# Patient Record
Sex: Female | Born: 1980 | Race: White | Hispanic: No | Marital: Married | Smoking: Former smoker
Health system: Southern US, Community
[De-identification: ages and names within clinical notes are randomized; demographics above are authoritative.]

## PROBLEM LIST (undated history)

## (undated) ENCOUNTER — Inpatient Hospital Stay (HOSPITAL_COMMUNITY): Payer: Self-pay

## (undated) DIAGNOSIS — R51 Headache: Secondary | ICD-10-CM

## (undated) DIAGNOSIS — F329 Major depressive disorder, single episode, unspecified: Secondary | ICD-10-CM

## (undated) DIAGNOSIS — L309 Dermatitis, unspecified: Secondary | ICD-10-CM

## (undated) DIAGNOSIS — R569 Unspecified convulsions: Secondary | ICD-10-CM

## (undated) DIAGNOSIS — K8020 Calculus of gallbladder without cholecystitis without obstruction: Secondary | ICD-10-CM

## (undated) DIAGNOSIS — F32A Depression, unspecified: Secondary | ICD-10-CM

## (undated) DIAGNOSIS — F419 Anxiety disorder, unspecified: Secondary | ICD-10-CM

## (undated) HISTORY — DX: Depression, unspecified: F32.A

## (undated) HISTORY — DX: Major depressive disorder, single episode, unspecified: F32.9

## (undated) HISTORY — DX: Anxiety disorder, unspecified: F41.9

## (undated) HISTORY — PX: NO PAST SURGERIES: SHX2092

---

## 2003-01-31 ENCOUNTER — Encounter: Payer: Self-pay | Admitting: Emergency Medicine

## 2003-01-31 ENCOUNTER — Emergency Department (HOSPITAL_COMMUNITY): Admission: AD | Admit: 2003-01-31 | Discharge: 2003-01-31 | Payer: Self-pay | Admitting: Emergency Medicine

## 2004-05-21 ENCOUNTER — Other Ambulatory Visit: Admission: RE | Admit: 2004-05-21 | Discharge: 2004-05-21 | Payer: Self-pay | Admitting: Family Medicine

## 2004-06-08 ENCOUNTER — Emergency Department (HOSPITAL_COMMUNITY): Admission: EM | Admit: 2004-06-08 | Discharge: 2004-06-09 | Payer: Self-pay

## 2004-07-08 ENCOUNTER — Ambulatory Visit (HOSPITAL_COMMUNITY): Admission: RE | Admit: 2004-07-08 | Discharge: 2004-07-08 | Payer: Self-pay | Admitting: Family Medicine

## 2004-09-14 ENCOUNTER — Inpatient Hospital Stay (HOSPITAL_COMMUNITY): Admission: AD | Admit: 2004-09-14 | Discharge: 2004-09-14 | Payer: Self-pay | Admitting: Family Medicine

## 2004-10-30 ENCOUNTER — Inpatient Hospital Stay (HOSPITAL_COMMUNITY): Admission: AD | Admit: 2004-10-30 | Discharge: 2004-10-30 | Payer: Self-pay | Admitting: Family Medicine

## 2004-11-02 ENCOUNTER — Observation Stay (HOSPITAL_COMMUNITY): Admission: AD | Admit: 2004-11-02 | Discharge: 2004-11-03 | Payer: Self-pay | Admitting: Family Medicine

## 2004-11-22 ENCOUNTER — Inpatient Hospital Stay (HOSPITAL_COMMUNITY): Admission: AD | Admit: 2004-11-22 | Discharge: 2004-11-22 | Payer: Self-pay | Admitting: Family Medicine

## 2004-11-24 ENCOUNTER — Inpatient Hospital Stay (HOSPITAL_COMMUNITY): Admission: AD | Admit: 2004-11-24 | Discharge: 2004-11-24 | Payer: Self-pay | Admitting: Family Medicine

## 2004-12-02 ENCOUNTER — Inpatient Hospital Stay (HOSPITAL_COMMUNITY): Admission: AD | Admit: 2004-12-02 | Discharge: 2004-12-04 | Payer: Self-pay | Admitting: Family Medicine

## 2004-12-02 ENCOUNTER — Encounter (INDEPENDENT_AMBULATORY_CARE_PROVIDER_SITE_OTHER): Payer: Self-pay | Admitting: Specialist

## 2005-07-07 ENCOUNTER — Emergency Department (HOSPITAL_COMMUNITY): Admission: EM | Admit: 2005-07-07 | Discharge: 2005-07-08 | Payer: Self-pay | Admitting: Emergency Medicine

## 2006-01-09 ENCOUNTER — Ambulatory Visit (HOSPITAL_COMMUNITY): Admission: RE | Admit: 2006-01-09 | Discharge: 2006-01-09 | Payer: Self-pay | Admitting: Obstetrics & Gynecology

## 2006-05-27 ENCOUNTER — Inpatient Hospital Stay (HOSPITAL_COMMUNITY): Admission: AD | Admit: 2006-05-27 | Discharge: 2006-05-27 | Payer: Self-pay | Admitting: Obstetrics

## 2006-06-09 ENCOUNTER — Inpatient Hospital Stay (HOSPITAL_COMMUNITY): Admission: AD | Admit: 2006-06-09 | Discharge: 2006-06-12 | Payer: Self-pay | Admitting: Obstetrics & Gynecology

## 2007-03-02 ENCOUNTER — Other Ambulatory Visit: Admission: RE | Admit: 2007-03-02 | Discharge: 2007-03-02 | Payer: Self-pay | Admitting: Family Medicine

## 2007-07-09 ENCOUNTER — Observation Stay (HOSPITAL_COMMUNITY): Admission: AD | Admit: 2007-07-09 | Discharge: 2007-07-09 | Payer: Self-pay | Admitting: Obstetrics and Gynecology

## 2007-11-03 ENCOUNTER — Inpatient Hospital Stay (HOSPITAL_COMMUNITY): Admission: AD | Admit: 2007-11-03 | Discharge: 2007-11-06 | Payer: Self-pay | Admitting: Obstetrics and Gynecology

## 2010-11-17 ENCOUNTER — Encounter: Payer: Self-pay | Admitting: Obstetrics & Gynecology

## 2011-03-14 NOTE — H&P (Signed)
Haley Clements, Haley Clements                  ACCOUNT NO.:  [id number]   MEDICAL RECORD NO.:  [id number]          PATIENT TYPE:  INP   LOCATION:  9167                          FACILITY:  [facility]   PHYSICIAN:  Roseanna Rainbow, M.D.DATE OF BIRTH:  September 24, 1981   DATE OF ADMISSION:  06/09/2006  DATE OF DISCHARGE:                                HISTORY & PHYSICAL   CHIEF COMPLAINT:  The patient is a 30 year old gravida 2, para 1, with an  estimated date of confinement of June 04, 2006, with an intrauterine  pregnancy at 40+ weeks, complaining of uterine contractions.   HISTORY OF PRESENT ILLNESS:  Please see the above.  She denies rupture of  membranes.   PAST MEDICAL HISTORY:  Depression.   ALLERGIES:  LATEX and NSAIDS.   MEDICATIONS:  Wellbutrin and prenatal vitamins.   OBSTETRICAL RISK FACTORS:  Short inter-pregnancy interval.   TEST RESULTS:  Hemoglobin is 12.5, hematocrit 37.5, platelets 225,000.  Chlamydia negative.  GC negative.  Urine culture and sensitivity:  No  growth.  One-hour GCT 109.  GBS on May 04, 2006 negative.  Hepatitis B  surface antigen negative.  HIV negative.  Blood type is O positive, antibody  screen negative.  RPR nonreactive.  Rubella immune.   PAST OBSTETRICAL HISTORY:  In February of 2006, she was delivered of a live-  born female, 7 pounds 7 ounces, spontaneous vaginal delivery complicated by  threatened preterm labor.   PAST GYNECOLOGICAL HISTORY:  Noncontributory.   PAST MEDICAL HISTORY:  No significant history of medical diseases.   PAST SURGICAL HISTORY:  She denies.   SOCIAL HISTORY:  She is employed in a [employer].  She is married, living  with her spouse, does not give any significant history of alcohol use.  She  has no significant smoking history.  Denies illicit drug use.   FAMILY HISTORY:  Heart disease, adult-onset diabetes, emphysema.   PHYSICAL EXAMINATION:  VITAL SIGNS:  Stable.  Afebrile.  Fetal heart tracing  reassuring.   Tocodynamometer:  Uterine contractions every 2-5 minutes.  PELVIC:  On sterile vaginal exam per the RN, cervix is 3-cm dilated, 80%  effaced with a bulging bag of water.   ASSESSMENT:  1. Primipara at term.  2. Early labor.  3. Fetal heart tracing consistent with fetal well-being.   PLAN:  Admission, expectant management, anticipate a spontaneous vaginal  delivery.      Roseanna Rainbow, M.D.  Electronically Signed     LAJ/MEDQ  D:  06/09/2006  T:  06/09/2006  Job:  [id number]

## 2011-03-14 NOTE — Discharge Summary (Signed)
Haley, Clements                  ACCOUNT NO.:  [id number]   MEDICAL RECORD NO.:  [id number]          PATIENT TYPE:  OBV   LOCATION:  9306                          [facility]ACILITY:  [facility]   PHYSICIAN:  Kendra H. Tenny Craw, MD     DATE OF BIRTH:  04/03/1981   DATE OF ADMISSION:  07/09/2007  DATE OF DISCHARGE:  07/09/2007                               DISCHARGE SUMMARY   FINAL DIAGNOSES:  1. Intrauterine pregnancy at 20-6/[redacted] weeks gestation.  2. Nausea/vomiting.   COMPLICATIONS:  None.   This 30 year old G3, P2, presents at [redacted] weeks gestation with nausea and  vomiting x1 day.  I do not have the patient's antepartum records in  here, but no noted complications up till this point.  The patient was  started on IV hydration, Phenergan, and was started on Ancef  IV.  Some  lab work was obtained.  The patient had a UTI that was treated with the  Ancef; otherwise, had no other complications.  She was feeling obviously  better on this regimen and was allowed to go home later that day.  She  was sent home on a bland diet, told to continue her vitamins daily, was  to follow up in our office on October 10th for her appointment; of  course to call if these symptoms returned.   LABORATORY DATA ON DISCHARGE:  The patient had a hemoglobin of 13.1, a  white blood cell count of 8.9, platelets of 198,000.  The patient also  had a normal liver function test, a normal amylase and lipase, with a  urine that looked positive for urinary tract infection.      Leilani Able, P.A.-C.      Freddrick March. Tenny Craw, MD  Electronically Signed    MB/MEDQ  D:  08/27/2007  T:  08/28/2007  Job:  [id number]

## 2011-07-16 LAB — RPR: RPR Ser Ql: NONREACTIVE

## 2011-07-16 LAB — CBC
HCT: 34.8 — ABNORMAL LOW
MCHC: 33.8
MCHC: 34.5
MCV: 85.1
Platelets: 184
Platelets: 198
RDW: 13.1

## 2011-08-08 LAB — COMPREHENSIVE METABOLIC PANEL
AST: 13
BUN: 4 — ABNORMAL LOW
CO2: 24
Calcium: 8.3 — ABNORMAL LOW
Chloride: 102
Creatinine, Ser: 0.4
GFR calc Af Amer: >60
GFR calc non Af Amer: >60
Total Bilirubin: 1.1

## 2011-08-08 LAB — URINALYSIS, ROUTINE W REFLEX MICROSCOPIC
Bilirubin Urine: NEGATIVE
Nitrite: POSITIVE — AB
Protein, ur: NEGATIVE
Specific Gravity, Urine: >1.03 — ABNORMAL HIGH
Urobilinogen, UA: 1

## 2011-08-08 LAB — CBC
HCT: 36.6
MCHC: 35.7
MCV: 85.3
RBC: 4.29
WBC: 8.9

## 2011-08-08 LAB — LIPASE, BLOOD: Lipase: 13

## 2011-08-08 LAB — DIFFERENTIAL
Basophils Absolute: 0
Eosinophils Relative: 0
Lymphocytes Relative: 6 — ABNORMAL LOW
Lymphs Abs: 0.6 — ABNORMAL LOW
Neutro Abs: 8 — ABNORMAL HIGH
Neutrophils Relative %: 90 — ABNORMAL HIGH

## 2011-08-08 LAB — URINE MICROSCOPIC-ADD ON

## 2011-08-08 LAB — KETONES, URINE: Ketones, ur: >80 — AB

## 2012-04-27 ENCOUNTER — Emergency Department (HOSPITAL_COMMUNITY)
Admission: EM | Admit: 2012-04-27 | Discharge: 2012-04-28 | Disposition: A | Discharge status: Home / Self Care | Payer: Managed Care, Other (non HMO) | Attending: Emergency Medicine | Admitting: Emergency Medicine

## 2012-04-27 ENCOUNTER — Emergency Department (HOSPITAL_COMMUNITY): Payer: Managed Care, Other (non HMO)

## 2012-04-27 ENCOUNTER — Encounter (HOSPITAL_COMMUNITY): Payer: Self-pay | Admitting: *Deleted

## 2012-04-27 DIAGNOSIS — Z79899 Other long term (current) drug therapy: Secondary | ICD-10-CM | POA: Insufficient documentation

## 2012-04-27 DIAGNOSIS — K8020 Calculus of gallbladder without cholecystitis without obstruction: Secondary | ICD-10-CM | POA: Insufficient documentation

## 2012-04-27 DIAGNOSIS — R197 Diarrhea, unspecified: Secondary | ICD-10-CM | POA: Insufficient documentation

## 2012-04-27 HISTORY — DX: Calculus of gallbladder without cholecystitis without obstruction: K80.20

## 2012-04-27 LAB — CBC WITH DIFFERENTIAL/PLATELET
Basophils Absolute: 0.1 10*3/uL (ref 0.0–0.1)
Lymphs Abs: 1.9 10*3/uL (ref 0.7–4.0)
MCV: 85.4 fL (ref 78.0–100.0)
Monocytes Absolute: 0.6 10*3/uL (ref 0.1–1.0)
Monocytes Relative: 9 % (ref 3–12)
Neutrophils Relative %: 62 % (ref 43–77)
Platelets: 267 10*3/uL (ref 150–400)
RBC: 4.79 MIL/uL (ref 3.87–5.11)
RDW: 12.3 % (ref 11.5–15.5)
WBC: 7.1 10*3/uL (ref 4.0–10.5)

## 2012-04-27 LAB — URINALYSIS, ROUTINE W REFLEX MICROSCOPIC
Hgb urine dipstick: NEGATIVE
Nitrite: NEGATIVE
Protein, ur: NEGATIVE mg/dL
Specific Gravity, Urine: 1.03 (ref 1.005–1.030)
Urobilinogen, UA: 0.2 mg/dL (ref 0.0–1.0)

## 2012-04-27 LAB — BASIC METABOLIC PANEL
Calcium: 9 mg/dL (ref 8.4–10.5)
Chloride: 102 mEq/L (ref 96–112)
Creatinine, Ser: 0.77 mg/dL (ref 0.50–1.10)
GFR calc Af Amer: >90 mL/min (ref >90)
Sodium: 140 mEq/L (ref 135–145)

## 2012-04-27 LAB — POCT PREGNANCY, URINE: Preg Test, Ur: NEGATIVE

## 2012-04-27 MED ORDER — SODIUM CHLORIDE 0.9 % IV SOLN
INTRAVENOUS | Status: DC
  Administered 2012-04-27: 21:00:00 via INTRAVENOUS

## 2012-04-27 MED ORDER — GI COCKTAIL ~~LOC~~
30.0000 mL | Freq: Once | ORAL | Status: AC
  Administered 2012-04-27: 30 mL via ORAL
  Filled 2012-04-27: qty 30

## 2012-04-27 MED ORDER — ONDANSETRON HCL 4 MG/2ML IJ SOLN
4.0000 mg | Freq: Once | INTRAMUSCULAR | Status: AC
  Administered 2012-04-27: 4 mg via INTRAVENOUS
  Filled 2012-04-27: qty 2

## 2012-04-27 MED ORDER — MORPHINE SULFATE 4 MG/ML IJ SOLN
4.0000 mg | Freq: Once | INTRAMUSCULAR | Status: AC
  Administered 2012-04-27: 4 mg via INTRAVENOUS
  Filled 2012-04-27: qty 1

## 2012-04-27 MED ORDER — SODIUM CHLORIDE 0.9 % IV BOLUS (SEPSIS): 500.0000 mL | Freq: Once | INTRAVENOUS | Status: DC

## 2012-04-27 MED ORDER — IOHEXOL 300 MG/ML  SOLN
80.0000 mL | Freq: Once | INTRAMUSCULAR | Status: AC | PRN
  Administered 2012-04-27: 80 mL via INTRAVENOUS

## 2012-04-27 MED ORDER — IOHEXOL 300 MG/ML  SOLN
20.0000 mL | INTRAMUSCULAR | Status: AC
  Administered 2012-04-27: 20 mL via ORAL

## 2012-04-27 NOTE — ED Notes (Signed)
Patients states she is still having yellowish liquid stools. Given cup of coke to drink. Plan discussed with patient. Will continue to monitor patient

## 2012-04-27 NOTE — ED Notes (Signed)
Complaining of abdominal pain which patient thought was her gallbladder. Started two weeks ago with nauseous after eating. Progressed to mucus diarrhea. Pain with both eating and drinking.

## 2012-04-27 NOTE — ED Provider Notes (Signed)
Care the patient seemed to the CDU. Patient was moved to CDU for holding awaiting CT of the abdomen and pelvis. She has had crampy sharp pain to the right upper quadrant for the past 2 weeks and reports mucous in her stools. Her CT shows gallstones and possible sludge without evidence of wall thickening which would be worrisome for cholecystitis. Patient states that she has known about gallstones for the past several years but has not previously seen surgery for this. Patient became nauseated on return from CT so was remedicated with GI cocktail and Zofran. She was able to tolerate a by mouth challenge with this. Patient will be discharged home with medications for symptomatic treatment including Zofran and Lomotil. Instructed to make followup with GI and surgery regarding her diarrhea and gallbladder disease respectively. Reasons to return to the ED discussed. She verbalized understanding and agreed to this plan.  Grant Fontana, PA-C 04/28/12 0003

## 2012-04-27 NOTE — ED Provider Notes (Signed)
History     CSN: [id number]  Arrival date & time 04/27/12  1804   First MD Initiated Contact with Patient 04/27/12 1951      Chief Complaint  Patient presents with  . Abdominal Pain    (Consider location/radiation/quality/duration/timing/severity/associated sxs/prior treatment) Patient is a 31 y.o. female presenting with abdominal pain. The history is provided by the patient.  Abdominal Pain The primary symptoms of the illness include abdominal pain, nausea, vomiting and diarrhea. The primary symptoms of the illness do not include shortness of breath.  Additional symptoms associated with the illness include chills. Symptoms associated with the illness do not include back pain.   patient has had abdominal pain for the last 2 weeks. She states it is in her right upper abdomen. She states she's had some chills. She has sharp cramping pain after eating and drinking. She aches all the time. She's had nausea vomiting after eating for the last 2 days. He's also had some diarrhea and mucousy stools for the last month. She states that in 2007 she was told that she had gallstones, but has done nothing because she doesn't want to have surgery. No vaginal bleeding or discharge. No weight loss. No blood in stool.  Past Medical History  Diagnosis Date  . Gallstone     History reviewed. No pertinent past surgical history.  History reviewed. No pertinent family history.  History  Substance Use Topics  . Smoking status: Former Games developer  . Smokeless tobacco: Not on file  . Alcohol Use: Yes     occ    OB History    Grav Para Term Preterm Abortions TAB SAB Ect Mult Living                  Review of Systems  Constitutional: Positive for chills and appetite change. Negative for activity change.  HENT: Negative for neck stiffness.   Eyes: Negative for pain.  Respiratory: Negative for chest tightness and shortness of breath.   Cardiovascular: Negative for chest pain and leg swelling.    Gastrointestinal: Positive for nausea, vomiting, abdominal pain and diarrhea.  Genitourinary: Negative for flank pain.  Musculoskeletal: Negative for back pain.  Skin: Negative for rash.  Neurological: Positive for headaches. Negative for weakness and numbness.  Psychiatric/Behavioral: Negative for behavioral problems.    Allergies  Strawberry  Home Medications   Current Outpatient Rx  Name Route Sig Dispense Refill  . BUSPIRONE HCL 15 MG PO TABS Oral Take 15 mg by mouth 2 (two) times daily as needed. For anxiety    . NORGESTIM-ETH ESTRAD TRIPHASIC 0.18/0.215/0.25 MG-35 MCG PO TABS Oral Take 1 tablet by mouth daily.    Marland Kitchen DIPHENOXYLATE-ATROPINE 2.5-0.025 MG PO TABS Oral Take 1 tablet by mouth 4 (four) times daily as needed for diarrhea or loose stools. 30 tablet 0  . HYDROCODONE-ACETAMINOPHEN 5-325 MG PO TABS Oral Take 1 tablet by mouth every 4 (four) hours as needed for pain. 6 tablet 0  . OMEPRAZOLE 20 MG PO CPDR Oral Take 1 capsule (20 mg total) by mouth daily. 30 capsule 0  . ONDANSETRON 4 MG PO TBDP Oral Take 1 tablet (4 mg total) by mouth every 8 (eight) hours as needed for nausea. 20 tablet 0    BP 119/79  Pulse 69  Temp 97.6 F (36.4 C) (Oral)  Resp 20  SpO2 99%  LMP 04/10/2012  Physical Exam  Nursing note and vitals reviewed. Constitutional: She is oriented to person, place, and time. She appears well-developed  and well-nourished.  HENT:  Head: Normocephalic and atraumatic.  Eyes: EOM are normal. Pupils are equal, round, and reactive to light.  Neck: Normal range of motion. Neck supple.  Cardiovascular: Normal rate, regular rhythm and normal heart sounds.   No murmur heard. Pulmonary/Chest: Effort normal and breath sounds normal. No respiratory distress. She has no wheezes. She has no rales.  Abdominal: Soft. Bowel sounds are normal. She exhibits no distension. There is tenderness. There is no rebound and no guarding.       Mild right-sided abdominal tenderness  without rebound or guarding.  Musculoskeletal: Normal range of motion.  Neurological: She is alert and oriented to person, place, and time. No cranial nerve deficit.  Skin: Skin is warm and dry.  Psychiatric: She has a normal mood and affect. Her speech is normal.    ED Course  Procedures (including critical care time)  Labs Reviewed  URINALYSIS, ROUTINE W REFLEX MICROSCOPIC - Abnormal; Notable for the following:    Color, Urine AMBER (*)  BIOCHEMICALS MAY BE AFFECTED BY COLOR   APPearance HAZY (*)     Bilirubin Urine SMALL (*)     Ketones, ur 15 (*)     All other components within normal limits  CBC WITH DIFFERENTIAL  BASIC METABOLIC PANEL  POCT PREGNANCY, URINE  LAB REPORT - SCANNED   Ct Abdomen Pelvis W Contrast  04/27/2012  *RADIOLOGY REPORT*  Clinical Data: Right upper quadrant pain, diarrhea.  CT ABDOMEN AND PELVIS WITH CONTRAST  Technique:  Multidetector CT imaging of the abdomen and pelvis was performed following the standard protocol during bolus administration of intravenous contrast.  Contrast: 80mL OMNIPAQUE IOHEXOL 300 MG/ML  SOLN, 1 OMNIPAQUE IOHEXOL 300 MG/ML  SOLN  Comparison: None  Findings: Limited images through the lung bases demonstrate no significant appreciable abnormality. The heart size is within normal limits. No pleural or pericardial effusion.  Unremarkable liver, spleen, pancreas, adrenal glands.  There is suggestion of sludge or small stones within the gallbladder. However, no gallbladder wall thickening or pericholecystic fluid. No biliary ductal dilatation.  Symmetric renal enhancement.  No hydronephrosis or hydroureter.  No bowel obstruction.  No CT evidence for colitis.  Normal appendix.  Normal caliber vasculature.  Partially decompressed bladder.  Uterus and adnexa within normal limits for CT.  No acute osseous finding.  IMPRESSION: No acute abnormality identified by CT.  There is a question of small gallstones or sludge layering dependently.  However, no CT  evidence for cholecystitis.  Original Report Authenticated By: Waneta Martins, M.D.     1. Cholelithiasis   2. Diarrhea       MDM  Patient with right upper quadrant pain for 2 weeks. She's also had some mucousy diarrhea. Laboratory reassuring. There is possible small gallstones. She was discharged home follow up with surgery as needed.        Juliet Rude. Rubin Payor, MD 04/28/12 [phone]

## 2012-04-27 NOTE — ED Notes (Addendum)
Pt. Reports abdominal pain in RUQ x 2 weeks. "Sharp cramping pain after eating and  Drinking. Aching pain all the time. N/V after eating x 2 days.  Reports having headaches "probably from not eating. A.O. X 4.  Hxt of gallstones 3 yrs ago.

## 2012-04-27 NOTE — ED Notes (Signed)
PT is here with right upper quad pain and sts she has been putting off surgery for gallbladder.  Pt is reporting nausea and jelly stool without blood for one month.

## 2012-04-28 MED ORDER — DIPHENOXYLATE-ATROPINE 2.5-0.025 MG PO TABS: 1.0000 | ORAL_TABLET | Freq: Four times a day (QID) | ORAL | Status: AC | PRN

## 2012-04-28 MED ORDER — HYDROCODONE-ACETAMINOPHEN 5-325 MG PO TABS: 1.0000 | ORAL_TABLET | ORAL | Status: AC | PRN

## 2012-04-28 MED ORDER — OMEPRAZOLE 20 MG PO CPDR: 20.0000 mg | DELAYED_RELEASE_CAPSULE | Freq: Every day | ORAL | Status: DC

## 2012-04-28 MED ORDER — ONDANSETRON 4 MG PO TBDP: 4.0000 mg | ORAL_TABLET | Freq: Three times a day (TID) | ORAL | Status: AC | PRN

## 2012-04-28 NOTE — ED Provider Notes (Signed)
Medical screening examination/treatment/procedure(s) were performed by non-physician practitioner and as supervising physician I was immediately available for consultation/collaboration.  Jun Rightmyer R. Yuktha Kerchner, MD 04/28/12 0023 

## 2012-04-28 NOTE — ED Notes (Signed)
Patient tolerating fluids. No complaints of nausea or vomiting at this time. Will continue to monitor patient

## 2012-05-26 ENCOUNTER — Ambulatory Visit (INDEPENDENT_AMBULATORY_CARE_PROVIDER_SITE_OTHER): Payer: Managed Care, Other (non HMO) | Admitting: Surgery

## 2012-08-23 LAB — OB RESULTS CONSOLE HIV ANTIBODY (ROUTINE TESTING): HIV: NONREACTIVE

## 2012-08-23 LAB — OB RESULTS CONSOLE HEPATITIS B SURFACE ANTIGEN: Hepatitis B Surface Ag: NEGATIVE

## 2012-08-23 LAB — OB RESULTS CONSOLE RPR: RPR: NONREACTIVE

## 2012-08-23 LAB — OB RESULTS CONSOLE ABO/RH: RH Type: POSITIVE

## 2012-10-27 NOTE — L&D Delivery Note (Addendum)
Delivery Note At 10:12 PM a viable and healthy female was delivered via Vaginal, Spontaneous Delivery (Presentation: ; Occiput Anterior).  APGAR: 8, 9; weight 8 lbs 0 oz.   Placenta status: Intact, Spontaneous.  Cord: 3 vessels.  Anesthesia: Epidural  Episiotomy: None Lacerations: None Est. Blood Loss (mL): 327  Mom to postpartum.  Baby to nursery-stable.  Mickel Baas 03/04/2013, 10:43 PM

## 2012-12-16 ENCOUNTER — Encounter (HOSPITAL_COMMUNITY): Payer: Self-pay

## 2012-12-16 ENCOUNTER — Inpatient Hospital Stay (HOSPITAL_COMMUNITY)
Admission: AD | Admit: 2012-12-16 | Discharge: 2012-12-16 | Disposition: A | Discharge status: Home / Self Care | Payer: Managed Care, Other (non HMO) | Source: Ambulatory Visit | Attending: Obstetrics & Gynecology | Admitting: Obstetrics & Gynecology

## 2012-12-16 DIAGNOSIS — L500 Allergic urticaria: Secondary | ICD-10-CM | POA: Insufficient documentation

## 2012-12-16 DIAGNOSIS — L240 Irritant contact dermatitis due to detergents: Secondary | ICD-10-CM

## 2012-12-16 DIAGNOSIS — O99891 Other specified diseases and conditions complicating pregnancy: Secondary | ICD-10-CM | POA: Insufficient documentation

## 2012-12-16 MED ORDER — PREDNISONE 20 MG PO TABS: 60.0000 mg | ORAL_TABLET | Freq: Every day | ORAL | Status: DC

## 2012-12-16 MED ORDER — DIPHENHYDRAMINE HCL 25 MG PO TABS: 25.0000 mg | ORAL_TABLET | Freq: Four times a day (QID) | ORAL | Status: DC | PRN

## 2012-12-16 MED ORDER — METHYLPREDNISOLONE SODIUM SUCC 125 MG IJ SOLR
125.0000 mg | Freq: Once | INTRAMUSCULAR | Status: AC
  Administered 2012-12-16: 125 mg via INTRAVENOUS
  Filled 2012-12-16: qty 2

## 2012-12-16 MED ORDER — HYDROCORTISONE 1 % EX CREA: TOPICAL_CREAM | Freq: Two times a day (BID) | CUTANEOUS | Status: DC

## 2012-12-16 MED ORDER — LACTATED RINGERS IV SOLN: INTRAVENOUS | Status: DC

## 2012-12-16 MED ORDER — DIPHENHYDRAMINE HCL 50 MG/ML IJ SOLN
50.0000 mg | Freq: Once | INTRAMUSCULAR | Status: AC
  Administered 2012-12-16: 50 mg via INTRAVENOUS
  Filled 2012-12-16: qty 1

## 2012-12-16 NOTE — MAU Note (Signed)
Pt presents for an allergic reaction to a new laundry detergent (a new scent).  She started using the product two days ago and the reaction began as small bumps on her fingers, and it has since progressed to hives all over her body as well as swelling in the extremities and eyes.  She was seen at [facility] around 0345 this morning and was given Benedryl and Vistaril, but the reaction has worsened.  Reports good fetal movement.  Denies any pregnancy related problems.

## 2012-12-16 NOTE — MAU Note (Signed)
Pt states had allergic reaction to sunflower scented Gain detergent. Call OB/GYN who told her to call her primary MD. Has hives all over her body, into her scalp, skin is red/hot to touch, hands and feet are swollen.

## 2012-12-16 NOTE — MAU Provider Note (Signed)
History     CSN: [id number]  Arrival date and time: 12/16/12 1656   None     Chief Complaint  Patient presents with  . Allergic Reaction   HPI 32 y.o. [id number] at [redacted]w[redacted]d with allergic reaction. Washing with new detergent for 4 days and broke out in itchy rash yesterday. Started on hands, now all over body and face. No throat itching or swelling. No wheezing, shortness of breath or cough. Was seen at ER last night and received benadryl and a prescription for hydroxyzine. However, she feels the rash and swelling in her hands is worse today.   Baby is moving normally. No bleeding, contractions or loss of fluid.   OB History   Grav Para Term Preterm Abortions TAB SAB Ect Mult Living   4 3 3  0 0 0 0 0 0 3      Past Medical History  Diagnosis Date  . Gallstone     History reviewed. No pertinent past surgical history.  Family History  Problem Relation Age of Onset  . Diabetes Father   . Hypertension Father   . Cancer Father   . Diabetes Maternal Grandmother   . Hypertension Maternal Grandmother     History  Substance Use Topics  . Smoking status: Former Games developer  . Smokeless tobacco: Not on file  . Alcohol Use: Yes     Comment: occ    Allergies:  Allergies  Allergen Reactions  . Naproxen Nausea And Vomiting  . Strawberry     Throat swells    Prescriptions prior to admission  Medication Sig Dispense Refill  . acetaminophen (TYLENOL) 500 MG tablet Take 1,000-1,500 mg by mouth every 6 (six) hours as needed for pain (For headache).      . busPIRone (BUSPAR) 15 MG tablet Take 7.5 mg by mouth daily as needed. For anxiety      . diphenhydrAMINE (BENADRYL) 25 MG tablet Take 25 mg by mouth every 6 (six) hours as needed for itching or allergies.      . flintstones complete (FLINTSTONES) 60 MG chewable tablet Chew 1 tablet by mouth 2 (two) times daily.      . hydrocortisone cream 1 % Apply 1 application topically daily as needed (For itching).      . hydrOXYzine (VISTARIL)  25 MG capsule Take 25 mg by mouth 2 (two) times daily as needed for itching.        Review of Systems  Constitutional: Negative for fever and chills.  Eyes: Negative for blurred vision and double vision.  Respiratory: Negative for cough, shortness of breath, wheezing and stridor.   Cardiovascular: Negative for chest pain and palpitations.  Gastrointestinal: Negative for nausea, vomiting, abdominal pain and diarrhea.  Genitourinary: Negative for dysuria.  Skin: Positive for itching and rash.  Neurological: Negative for dizziness and headaches.   Physical Exam   Blood pressure 121/69, pulse 121, temperature 98.3 F (36.8 C), temperature source Oral, resp. rate 18, height 5\' 1"  (1.549 m), weight 66.792 kg (147 lb 4 oz), last menstrual period 04/10/2012, SpO2 100.00%.  Physical Exam  Constitutional: She is oriented to person, place, and time. She appears well-developed and well-nourished. No distress.  HENT:  Head: Normocephalic and atraumatic.  Mouth/Throat: Oropharynx is clear and moist.  Eyes: Conjunctivae and EOM are normal.  Neck: Normal range of motion. Neck supple.  Cardiovascular: Regular rhythm and normal heart sounds.   Tachycardia  Respiratory: Effort normal and breath sounds normal. No respiratory distress. She has no wheezes.  GI: Soft. Bowel sounds are normal. There is no tenderness. There is no rebound and no guarding.  Neurological: She is alert and oriented to person, place, and time.  Skin: Skin is warm and dry.  Psychiatric: She has a normal mood and affect.  Skin:  Erythematous wheals and flares on extremities, trunk and face. Hands edematous and red bilaterally, tender.   FHTs:  155-160, mod var, accels present, no decels (occasional short variable) TOCO:  Irritability  MAU Course  Procedures  Solumedrol 125 mg and 50 mg benadryl given in MAU.  Assessment and Plan  32 y.o. [id number] at [redacted]w[redacted]d with allergic reaction to detergent - No respiratory distress,  improved with benadryl. - Discussed with Dr. Claiborne Billings. Discharge home with prednisone through the weekend (60 mg a day) and benadryl (25-50 mg q 6 hr). F/u in clinic on Monday. - Patient to return if any difficulty breathing or tightening of throat.  Napoleon Form 12/16/2012, 6:21 PM

## 2013-01-31 ENCOUNTER — Telehealth: Payer: Self-pay

## 2013-01-31 DIAGNOSIS — Z3490 Encounter for supervision of normal pregnancy, unspecified, unspecified trimester: Secondary | ICD-10-CM

## 2013-01-31 NOTE — Telephone Encounter (Signed)
Chart is on desk

## 2013-01-31 NOTE — Telephone Encounter (Signed)
Need to see chart

## 2013-01-31 NOTE — Telephone Encounter (Signed)
Requested Medical records pull the the chart

## 2013-01-31 NOTE — Telephone Encounter (Signed)
Patient calling in to back date referral for pregnancy that she said we referred her to [facility]  Do not see in her chart that we did that and when we last saw her she had [place] insurance not Medicaid that she says needs to be back dated.  She was seen 12/16/12 for Hives and time before that was sinusitis

## 2013-01-31 NOTE — Telephone Encounter (Signed)
Do not see where we done referral. Pt came in for urine pregnancy test on 07/08/12 but patient was not seen by provider

## 2013-02-03 NOTE — Addendum Note (Signed)
Addended by: Bennie Pierini on: 02/03/2013 10:44 AM   Modules accepted: Orders

## 2013-02-03 NOTE — Telephone Encounter (Signed)
Referral sent to [facility].  

## 2013-02-03 NOTE — Telephone Encounter (Signed)
NEEDS A REFERRAL FROM THE DAY SHE HAD HER PREGNANCY TEST BECAUSE HER MEDICAID CARD HAS OUR NAME ON IT THEY SAID THE ONLY WAY THEY CAN GET IT CHANGED TO [facility] IS IF WE WRITE UP THE REFERRAL AND SEND IT TO THEM

## 2013-02-03 NOTE — Telephone Encounter (Signed)
Referral request sent in

## 2013-02-04 ENCOUNTER — Telehealth: Payer: Self-pay | Admitting: Nurse Practitioner

## 2013-02-11 LAB — OB RESULTS CONSOLE GBS: GBS: NEGATIVE

## 2013-02-15 NOTE — Telephone Encounter (Signed)
Referral sent into [facility] and referral was made

## 2013-02-16 ENCOUNTER — Telehealth: Payer: Self-pay

## 2013-02-16 NOTE — Telephone Encounter (Signed)
Spoke with Alvin Critchley at [facility] she stated pt needed referral for ob dr date back to Sept - informed her I would call [facility] in which I did and left message in billing dept with Sue Lush voice mail of our NPI and giving authorization for Lorilee Cafarella back to September 2013.

## 2013-02-16 NOTE — Telephone Encounter (Signed)
Needs [facility] for pregnancy  [facility]

## 2013-02-19 ENCOUNTER — Inpatient Hospital Stay (HOSPITAL_COMMUNITY)
Admission: AD | Admit: 2013-02-19 | Discharge: 2013-02-20 | Disposition: A | Discharge status: Home / Self Care | Payer: Medicaid Other | Source: Ambulatory Visit | Attending: Obstetrics and Gynecology | Admitting: Obstetrics and Gynecology

## 2013-02-19 ENCOUNTER — Encounter (HOSPITAL_COMMUNITY): Payer: Self-pay | Admitting: *Deleted

## 2013-02-19 DIAGNOSIS — O212 Late vomiting of pregnancy: Secondary | ICD-10-CM | POA: Insufficient documentation

## 2013-02-19 DIAGNOSIS — O479 False labor, unspecified: Secondary | ICD-10-CM | POA: Insufficient documentation

## 2013-02-19 NOTE — MAU Note (Signed)
To recheck in 2 hours per Dr Dareen Piano

## 2013-02-19 NOTE — MAU Note (Signed)
Contractions since about 1700 which are getting closer and stronger. Vomiting some due to pain. No leaking or bleeding. Some vag d/c

## 2013-02-25 ENCOUNTER — Inpatient Hospital Stay (HOSPITAL_COMMUNITY)
Admission: AD | Admit: 2013-02-25 | Discharge: 2013-02-25 | Disposition: A | Discharge status: Home / Self Care | Payer: Managed Care, Other (non HMO) | Source: Ambulatory Visit | Attending: Obstetrics and Gynecology | Admitting: Obstetrics and Gynecology

## 2013-02-25 ENCOUNTER — Encounter (HOSPITAL_COMMUNITY): Payer: Self-pay | Admitting: Family

## 2013-02-25 DIAGNOSIS — O479 False labor, unspecified: Secondary | ICD-10-CM | POA: Insufficient documentation

## 2013-02-25 NOTE — MAU Note (Signed)
Pt states was 3cm in office at last exam, began having ctx's yesterday around 1730. Now q5-7 minutes apart and breathing with u/c's. 4th baby. Unsure if leaking amniotic fluid. Has had wet underwear after voiding today.

## 2013-02-25 NOTE — MAU Note (Signed)
Patient presents to MAU with c/o contractions every 5 minutes since 1030 today. Reports + fetal movement; denies vaginal bleeding.  Reports feeling wet in underwear since Wednesday; denies trickling or gushes of fluid.

## 2013-02-26 IMAGING — CT CT ABD-PELV W/ CM
2 of 4 series · 17 of 46 positions shown, 19 images · IV contrast (omnipaque)
Comparison: None

CLINICAL DATA: Right upper quadrant pain, diarrhea.

CT ABDOMEN AND PELVIS WITH CONTRAST
TECHNIQUE: Multidetector CT imaging of the abdomen and pelvis was
performed following the standard protocol during bolus
administration of intravenous contrast.
Contrast: 80mL OMNIPAQUE IOHEXOL 300 MG/ML  SOLN, 1 OMNIPAQUE
IOHEXOL 300 MG/ML  SOLN

[Series 2: routine abdomen · axial · 0.73mm/px · z∈[-398,-38]mm · 14 of 79 slices shown, 16 images]
[im 4/79  soft-tissue]
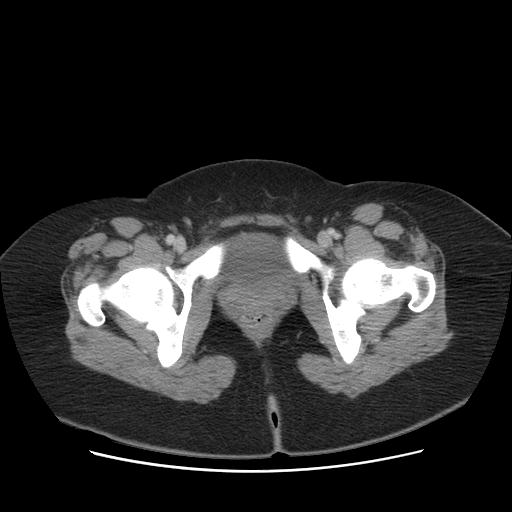
[im 4/79  bone]
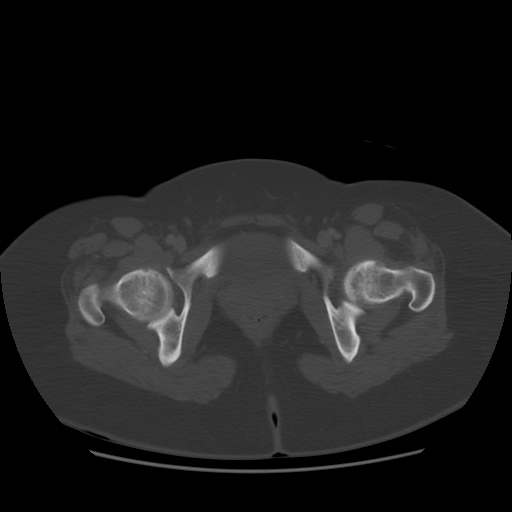
[im 11/79  soft-tissue]
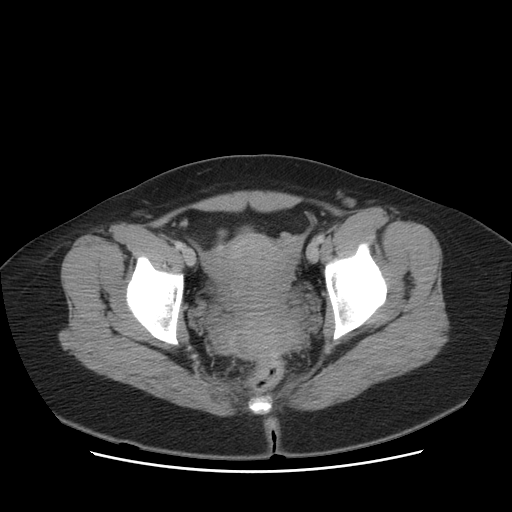
[im 14/79  soft-tissue]
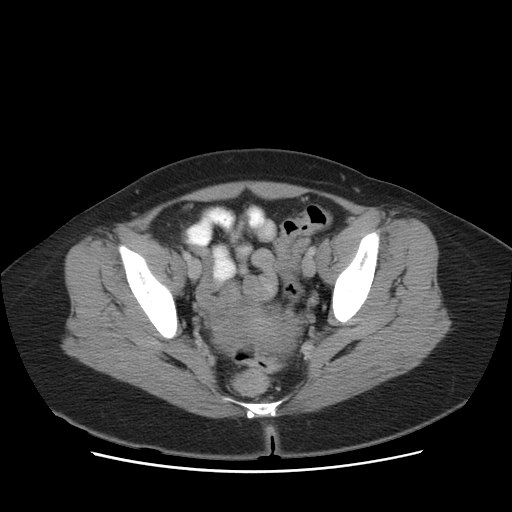
[im 21/79  soft-tissue]
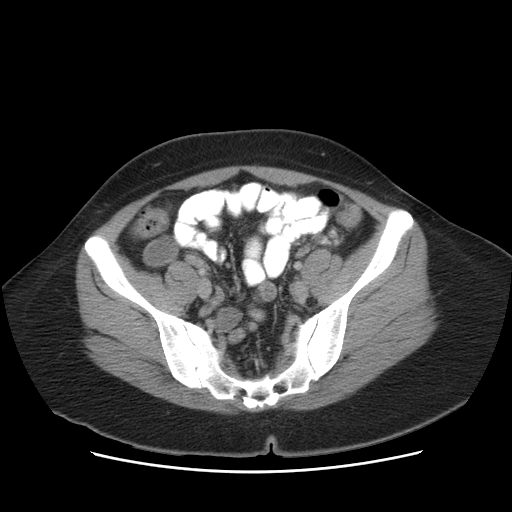
[im 28/79  soft-tissue]
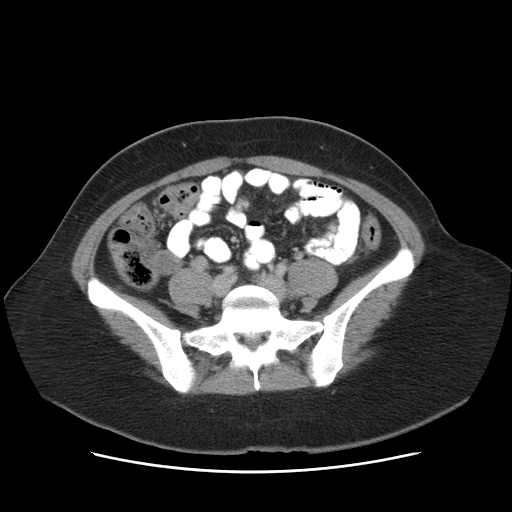
[im 31/79  soft-tissue]
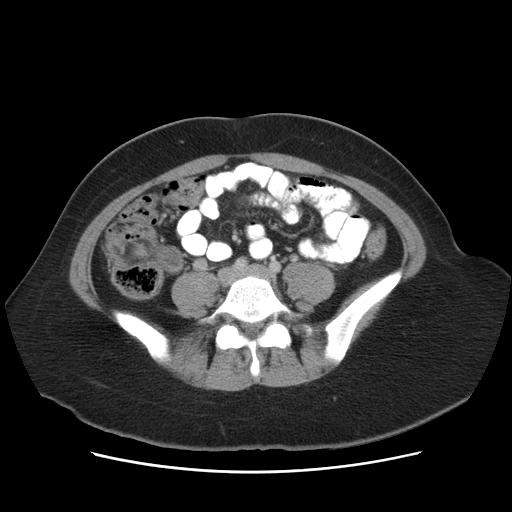
[im 38/79  soft-tissue]
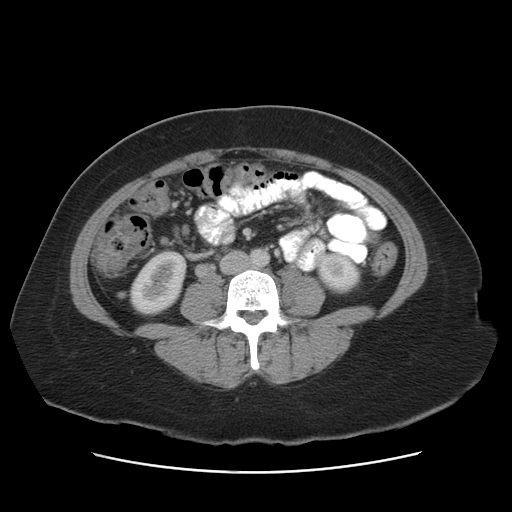
[im 41/79  soft-tissue]
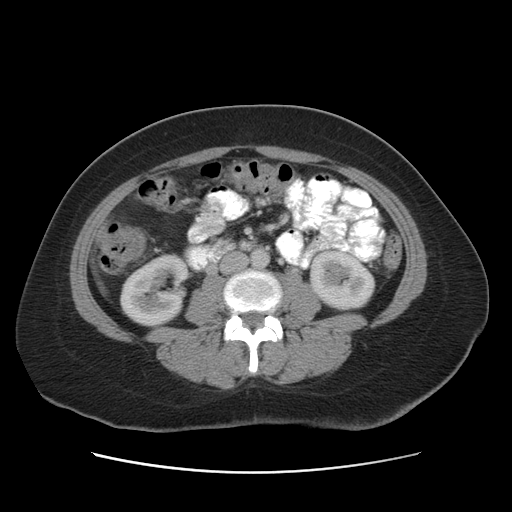
[im 48/79  soft-tissue]
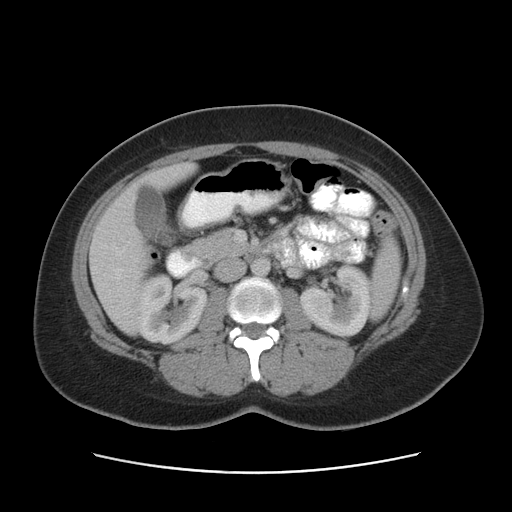
[im 48/79  bone]
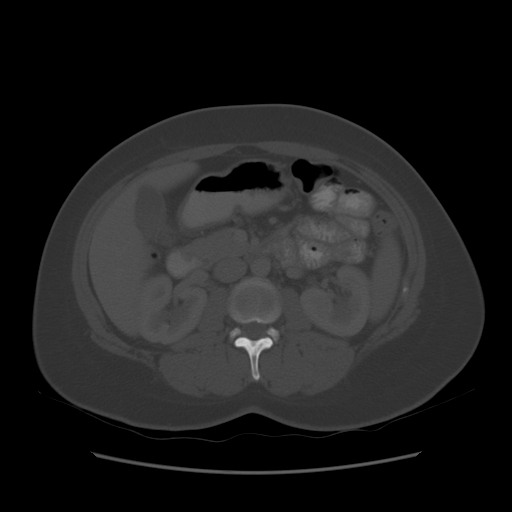
[im 51/79  soft-tissue]
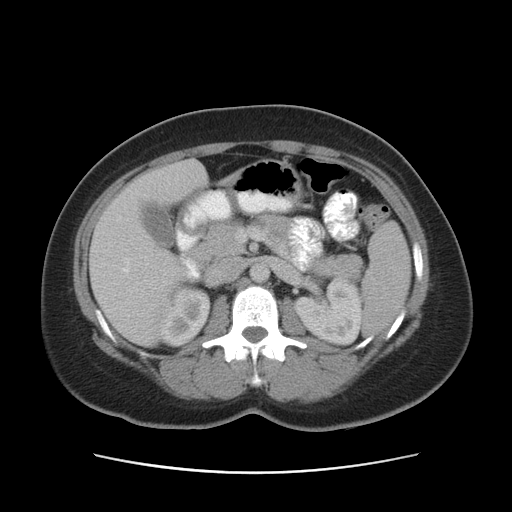
[im 58/79  soft-tissue]
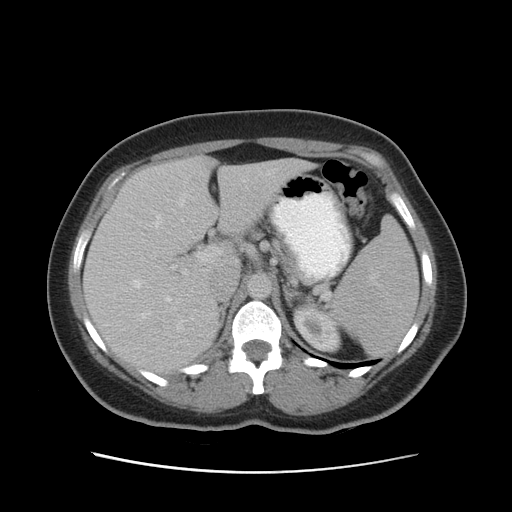
[im 65/79  soft-tissue]
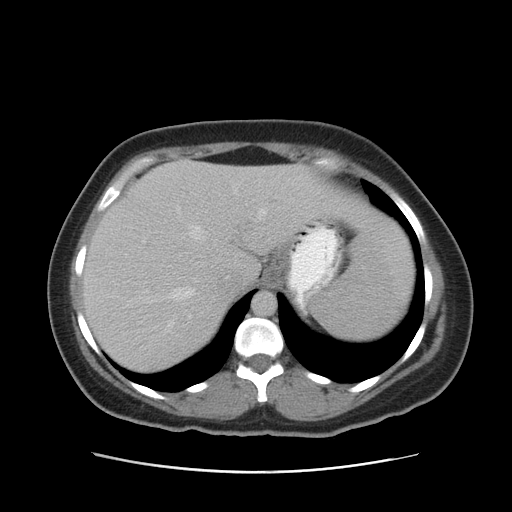
[im 68/79  soft-tissue]
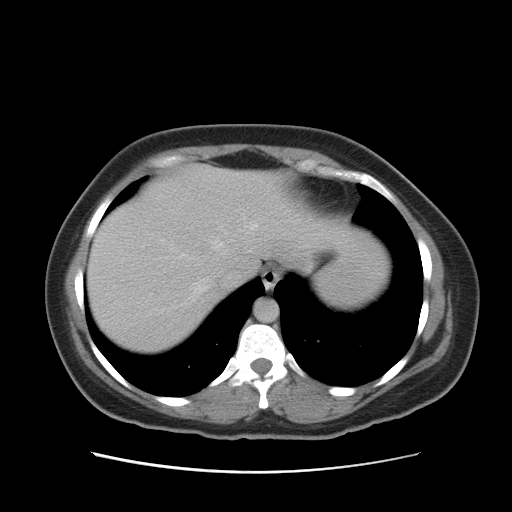
[im 75/79  soft-tissue]
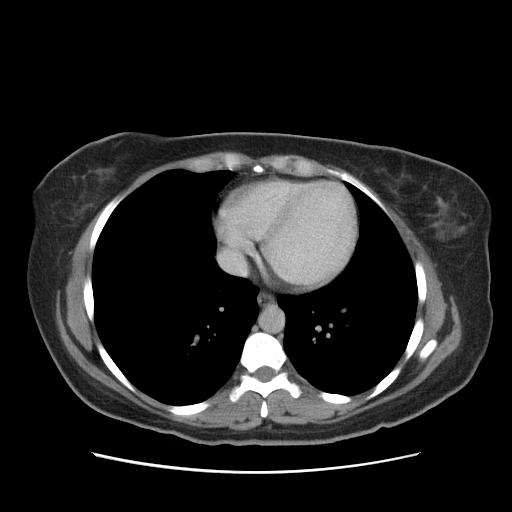

[Series 401: cor · coronal · 0.87mm/px · 3 of 91 slices shown]
[im 31/91  soft-tissue]
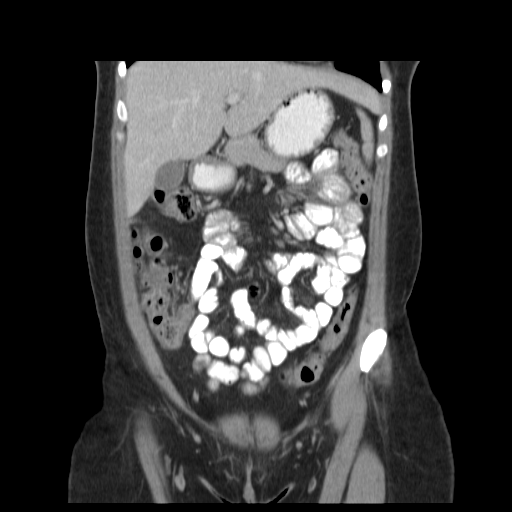
[im 41/91  soft-tissue]
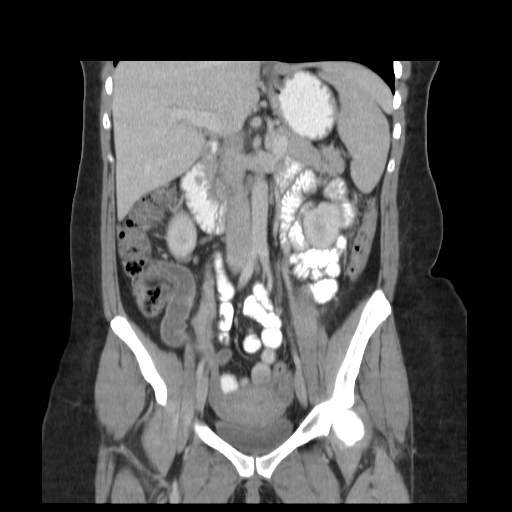
[im 51/91  soft-tissue]
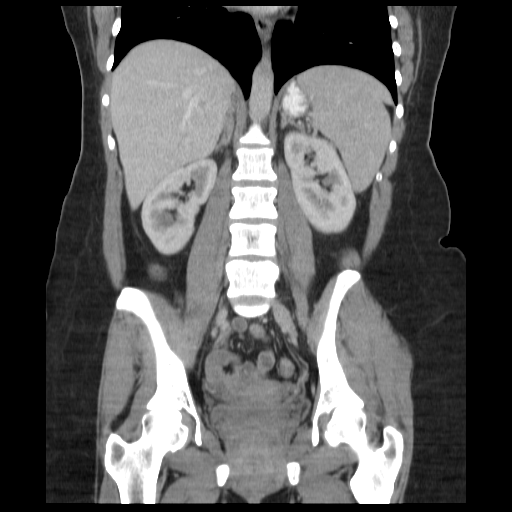

[17 of 46 positions shown; findings below may reference images not displayed]

FINDINGS: Limited images through the lung bases demonstrate no
significant appreciable abnormality. The heart size is within
normal limits. No pleural or pericardial effusion.

Unremarkable liver, spleen, pancreas, adrenal glands.  There is
suggestion of sludge or small stones within the gallbladder.
However, no gallbladder wall thickening or pericholecystic fluid.
No biliary ductal dilatation.

Symmetric renal enhancement.  No hydronephrosis or hydroureter.

No bowel obstruction.  No CT evidence for colitis.  Normal
appendix.

Normal caliber vasculature.

Partially decompressed bladder.  Uterus and adnexa within normal
limits for CT.

No acute osseous finding.
IMPRESSION: No acute abnormality identified by CT.

There is a question of small gallstones or sludge layering
dependently.  However, no CT evidence for cholecystitis.

## 2013-03-04 ENCOUNTER — Telehealth (HOSPITAL_COMMUNITY): Payer: Self-pay | Admitting: *Deleted

## 2013-03-04 ENCOUNTER — Inpatient Hospital Stay (HOSPITAL_COMMUNITY): Payer: Managed Care, Other (non HMO) | Admitting: Anesthesiology

## 2013-03-04 ENCOUNTER — Encounter (HOSPITAL_COMMUNITY): Payer: Self-pay | Admitting: Anesthesiology

## 2013-03-04 ENCOUNTER — Encounter (HOSPITAL_COMMUNITY): Payer: Self-pay | Admitting: *Deleted

## 2013-03-04 ENCOUNTER — Inpatient Hospital Stay (HOSPITAL_COMMUNITY)
Admission: AD | Admit: 2013-03-04 | Discharge: 2013-03-06 | DRG: 775 | Disposition: A | Discharge status: Home / Self Care | Payer: Managed Care, Other (non HMO) | Source: Ambulatory Visit | Attending: Obstetrics & Gynecology | Admitting: Obstetrics & Gynecology

## 2013-03-04 HISTORY — DX: Headache: R51

## 2013-03-04 HISTORY — DX: Unspecified convulsions: R56.9

## 2013-03-04 HISTORY — DX: Dermatitis, unspecified: L30.9

## 2013-03-04 LAB — CBC
HCT: 34.1 % — ABNORMAL LOW (ref 36.0–46.0)
MCHC: 34.3 g/dL (ref 30.0–36.0)
RDW: 13.6 % (ref 11.5–15.5)

## 2013-03-04 MED ORDER — PHENYLEPHRINE 40 MCG/ML (10ML) SYRINGE FOR IV PUSH (FOR BLOOD PRESSURE SUPPORT)
80.0000 ug | PREFILLED_SYRINGE | INTRAVENOUS | Status: DC | PRN
  Filled 2013-03-04: qty 2

## 2013-03-04 MED ORDER — LIDOCAINE HCL (PF) 1 % IJ SOLN
30.0000 mL | INTRAMUSCULAR | Status: DC | PRN
  Filled 2013-03-04 (×2): qty 30

## 2013-03-04 MED ORDER — DIPHENHYDRAMINE HCL 50 MG/ML IJ SOLN: 12.5000 mg | INTRAMUSCULAR | Status: DC | PRN

## 2013-03-04 MED ORDER — CITRIC ACID-SODIUM CITRATE 334-500 MG/5ML PO SOLN: 30.0000 mL | ORAL | Status: DC | PRN

## 2013-03-04 MED ORDER — LACTATED RINGERS IV SOLN: 500.0000 mL | INTRAVENOUS | Status: DC | PRN

## 2013-03-04 MED ORDER — OXYTOCIN 40 UNITS IN LACTATED RINGERS INFUSION - SIMPLE MED
62.5000 mL/h | INTRAVENOUS | Status: DC
  Administered 2013-03-04: 62.5 mL/h via INTRAVENOUS
  Filled 2013-03-04: qty 1000

## 2013-03-04 MED ORDER — OXYTOCIN BOLUS FROM INFUSION
500.0000 mL | INTRAVENOUS | Status: DC
  Administered 2013-03-04: 500 mL via INTRAVENOUS

## 2013-03-04 MED ORDER — ACETAMINOPHEN 325 MG PO TABS: 650.0000 mg | ORAL_TABLET | ORAL | Status: DC | PRN

## 2013-03-04 MED ORDER — IBUPROFEN 600 MG PO TABS
600.0000 mg | ORAL_TABLET | Freq: Four times a day (QID) | ORAL | Status: DC | PRN
  Filled 2013-03-04 (×4): qty 1

## 2013-03-04 MED ORDER — OXYTOCIN 40 UNITS IN LACTATED RINGERS INFUSION - SIMPLE MED
1.0000 m[IU]/min | INTRAVENOUS | Status: DC
  Administered 2013-03-04: 2 m[IU]/min via INTRAVENOUS

## 2013-03-04 MED ORDER — EPHEDRINE 5 MG/ML INJ
10.0000 mg | INTRAVENOUS | Status: DC | PRN
  Filled 2013-03-04: qty 2

## 2013-03-04 MED ORDER — TERBUTALINE SULFATE 1 MG/ML IJ SOLN: 0.2500 mg | Freq: Once | INTRAMUSCULAR | Status: AC | PRN

## 2013-03-04 MED ORDER — OXYCODONE-ACETAMINOPHEN 5-325 MG PO TABS: 1.0000 | ORAL_TABLET | ORAL | Status: DC | PRN

## 2013-03-04 MED ORDER — LACTATED RINGERS IV SOLN: 500.0000 mL | Freq: Once | INTRAVENOUS | Status: DC

## 2013-03-04 MED ORDER — EPHEDRINE 5 MG/ML INJ
INTRAVENOUS | Status: AC
  Filled 2013-03-04: qty 4

## 2013-03-04 MED ORDER — FENTANYL 2.5 MCG/ML BUPIVACAINE 1/10 % EPIDURAL INFUSION (WH - ANES)
14.0000 mL/h | INTRAMUSCULAR | Status: DC | PRN
  Administered 2013-03-04: 14 mL/h via EPIDURAL

## 2013-03-04 MED ORDER — BUTORPHANOL TARTRATE 1 MG/ML IJ SOLN: 1.0000 mg | INTRAMUSCULAR | Status: DC | PRN

## 2013-03-04 MED ORDER — LACTATED RINGERS IV SOLN
INTRAVENOUS | Status: DC
  Administered 2013-03-04 (×2): via INTRAVENOUS

## 2013-03-04 MED ORDER — FENTANYL 2.5 MCG/ML BUPIVACAINE 1/10 % EPIDURAL INFUSION (WH - ANES)
INTRAMUSCULAR | Status: AC
  Filled 2013-03-04: qty 125

## 2013-03-04 MED ORDER — LIDOCAINE HCL (PF) 1 % IJ SOLN
INTRAMUSCULAR | Status: DC | PRN
  Administered 2013-03-04 (×2): 5 mL

## 2013-03-04 MED ORDER — PHENYLEPHRINE 40 MCG/ML (10ML) SYRINGE FOR IV PUSH (FOR BLOOD PRESSURE SUPPORT)
PREFILLED_SYRINGE | INTRAVENOUS | Status: AC
  Filled 2013-03-04: qty 5

## 2013-03-04 MED ORDER — ONDANSETRON HCL 4 MG/2ML IJ SOLN: 4.0000 mg | Freq: Four times a day (QID) | INTRAMUSCULAR | Status: DC | PRN

## 2013-03-04 NOTE — Anesthesia Preprocedure Evaluation (Signed)
Anesthesia Evaluation  Patient identified by MRN, date of birth, ID band Patient awake    Reviewed: Allergy & Precautions, H&P , Patient's Chart, lab work & pertinent test results  Airway Mallampati: II TM Distance: >3 FB Neck ROM: full    Dental no notable dental hx.    Pulmonary neg pulmonary ROS,  breath sounds clear to auscultation  Pulmonary exam normal       Cardiovascular negative cardio ROS  Rhythm:regular Rate:Normal     Neuro/Psych  Headaches, Seizures -,  negative neurological ROS  negative psych ROS   GI/Hepatic negative GI ROS, Neg liver ROS,   Endo/Other  negative endocrine ROS  Renal/GU negative Renal ROS     Musculoskeletal   Abdominal   Peds  Hematology negative hematology ROS (+)   Anesthesia Other Findings Gallstone     Anxiety        Headache     Seizures   during puberty    Eczema     Depression   no meds with preg, doing ok    Reproductive/Obstetrics (+) Pregnancy                           Anesthesia Physical Anesthesia Plan  ASA: II  Anesthesia Plan: Epidural   Post-op Pain Management:    Induction:   Airway Management Planned:   Additional Equipment:   Intra-op Plan:   Post-operative Plan:   Informed Consent: I have reviewed the patients History and Physical, chart, labs and discussed the procedure including the risks, benefits and alternatives for the proposed anesthesia with the patient or authorized representative who has indicated his/her understanding and acceptance.     Plan Discussed with:   Anesthesia Plan Comments:         Anesthesia Quick Evaluation

## 2013-03-04 NOTE — Anesthesia Procedure Notes (Signed)
Epidural Patient location during procedure: OB Start time: 03/04/2013 3:11 PM  Staffing Anesthesiologist: Angus Seller., Harrell Gave. Performed by: anesthesiologist   Preanesthetic Checklist Completed: patient identified, site marked, surgical consent, pre-op evaluation, timeout performed, IV checked, risks and benefits discussed and monitors and equipment checked  Epidural Patient position: sitting Prep: site prepped and draped and DuraPrep Patient monitoring: continuous pulse ox and blood pressure Approach: midline Injection technique: LOR air and LOR saline  Needle:  Needle type: Tuohy  Needle gauge: 17 G Needle length: 9 cm and 9 Needle insertion depth: 5 cm cm Catheter type: closed end flexible Catheter size: 19 Gauge Catheter at skin depth: 10 cm Test dose: negative  Assessment Events: blood not aspirated, injection not painful, no injection resistance, negative IV test and no paresthesia  Additional Notes Patient identified.  Risk benefits discussed including failed block, incomplete pain control, headache, nerve damage, paralysis, blood pressure changes, nausea, vomiting, reactions to medication both toxic or allergic, and postpartum back pain.  Patient expressed understanding and wished to proceed.  All questions were answered.  Sterile technique used throughout procedure and epidural site dressed with sterile barrier dressing. No paresthesia or other complications noted.The patient did not experience any signs of intravascular injection such as tinnitus or metallic taste in mouth nor signs of intrathecal spread such as rapid motor block. Please see nursing notes for vital signs.

## 2013-03-04 NOTE — H&P (Signed)
32 y.o. [id number]  Estimated Date of Delivery: 03/12/13 admitted at 38/[redacted] weeks gestation inlabor.  Prenatal Transfer Tool  Maternal Diabetes: No Genetic Screening: Normal Maternal Ultrasounds/Referrals: Normal Fetal Ultrasounds or other Referrals:  None Maternal Substance Abuse:  No Significant Maternal Medications:  None Significant Maternal Lab Results: None Other Significant Pregnancy Complications:  None  Afebrile, VSS Heart and Lungs: No active disease Abdomen: soft, gravid, EFW AGA. Cervical exam:  5/80  Impression: Labor  Plan:  Admit for delivery

## 2013-03-04 NOTE — MAU Note (Signed)
Was 3 cm yesterday, contractions have gotten closer and stronger, heavy bloody show.  4th baby. No problems with preg.

## 2013-03-04 NOTE — Telephone Encounter (Signed)
Preadmission screen  

## 2013-03-05 LAB — ABO/RH: ABO/RH(D): O POS

## 2013-03-05 LAB — CBC
HCT: 33.2 % — ABNORMAL LOW (ref 36.0–46.0)
MCHC: 33.4 g/dL (ref 30.0–36.0)
RDW: 13.6 % (ref 11.5–15.5)

## 2013-03-05 MED ORDER — ZOLPIDEM TARTRATE 5 MG PO TABS: 5.0000 mg | ORAL_TABLET | Freq: Every evening | ORAL | Status: DC | PRN

## 2013-03-05 MED ORDER — ONDANSETRON HCL 4 MG/2ML IJ SOLN: 4.0000 mg | INTRAMUSCULAR | Status: DC | PRN

## 2013-03-05 MED ORDER — DIBUCAINE 1 % RE OINT: 1.0000 "application " | TOPICAL_OINTMENT | RECTAL | Status: DC | PRN

## 2013-03-05 MED ORDER — DIPHENHYDRAMINE HCL 25 MG PO CAPS: 25.0000 mg | ORAL_CAPSULE | Freq: Four times a day (QID) | ORAL | Status: DC | PRN

## 2013-03-05 MED ORDER — TETANUS-DIPHTH-ACELL PERTUSSIS 5-2.5-18.5 LF-MCG/0.5 IM SUSP: 0.5000 mL | Freq: Once | INTRAMUSCULAR | Status: DC

## 2013-03-05 MED ORDER — SENNOSIDES-DOCUSATE SODIUM 8.6-50 MG PO TABS: 2.0000 | ORAL_TABLET | Freq: Every day | ORAL | Status: DC

## 2013-03-05 MED ORDER — LANOLIN HYDROUS EX OINT: TOPICAL_OINTMENT | CUTANEOUS | Status: DC | PRN

## 2013-03-05 MED ORDER — ONDANSETRON HCL 4 MG PO TABS: 4.0000 mg | ORAL_TABLET | ORAL | Status: DC | PRN

## 2013-03-05 MED ORDER — IBUPROFEN 600 MG PO TABS
600.0000 mg | ORAL_TABLET | Freq: Four times a day (QID) | ORAL | Status: DC
  Administered 2013-03-05 – 2013-03-06 (×4): 600 mg via ORAL
  Filled 2013-03-05: qty 1

## 2013-03-05 MED ORDER — WITCH HAZEL-GLYCERIN EX PADS: 1.0000 "application " | MEDICATED_PAD | CUTANEOUS | Status: DC | PRN

## 2013-03-05 MED ORDER — PRENATAL MULTIVITAMIN CH
1.0000 | ORAL_TABLET | Freq: Every day | ORAL | Status: DC
Start: 2013-03-05 — End: 2013-03-06
  Administered 2013-03-05: 1 via ORAL
  Filled 2013-03-05: qty 1

## 2013-03-05 MED ORDER — BENZOCAINE-MENTHOL 20-0.5 % EX AERO
1.0000 "application " | INHALATION_SPRAY | CUTANEOUS | Status: DC | PRN
  Administered 2013-03-05: 1 via TOPICAL
  Filled 2013-03-05: qty 56

## 2013-03-05 MED ORDER — OXYCODONE-ACETAMINOPHEN 5-325 MG PO TABS: 1.0000 | ORAL_TABLET | ORAL | Status: DC | PRN

## 2013-03-05 MED ORDER — SIMETHICONE 80 MG PO CHEW: 80.0000 mg | CHEWABLE_TABLET | ORAL | Status: DC | PRN

## 2013-03-05 NOTE — Clinical Social Work Psychosocial (Signed)
    Clinical Social Work Department BRIEF PSYCHOSOCIAL ASSESSMENT 03/05/2013  Patient:  ANAGABRIELA, JOKERST     Account Number:  [id number]     Admit date:  03/04/2013  Clinical Social Worker:  Melene Plan  Date/Time:  03/05/2013 10:50 AM  Referred by:  Physician  Date Referred:  03/05/2013 Referred for  Behavioral Health Issues   Other Referral:   Hx of depression/anxiety   Interview type:  Patient Other interview type:    PSYCHOSOCIAL DATA Living Status:  HUSBAND Admitted from facility:   Level of care:   Primary support name:  Jumanah Hynson Primary support relationship to patient:  SPOUSE Degree of support available:   Involved    CURRENT CONCERNS Current Concerns  Behavioral Health Issues   Other Concerns:    SOCIAL WORK ASSESSMENT / PLAN CSW met with pt to discuss history of depression/anxiety. Pt's symptoms were being treated with Buspar, prior to pregnancy confirmation.  Pt has experienced issues with depression/anxiety, "on going."  She was able to cope well without medication during the pregnancy.  She does not plan to restart the medication upon discharge, unless PP depression symptoms arise.  Pt identified her spouse as her primary support person.  She denies any SI history.  CSW provided pt with PP depression literature & encouraged her to seek medical attention if symptoms are unmanageable.   Assessment/plan status:  No Further Intervention Required Other assessment/ plan:   Information/referral to community resources:   PP depression literature given.    PATIENT'S/FAMILY'S RESPONSE TO PLAN OF CARE: Pt was appropriate & thanked CSW for resources.

## 2013-03-05 NOTE — Anesthesia Postprocedure Evaluation (Signed)
  Anesthesia Post-op Note  Patient: Haley Clements  Procedure(s) Performed: * No procedures listed *  Patient Location: PACU and Mother/Baby  Anesthesia Type:Epidural  Level of Consciousness: awake, alert  and oriented  Airway and Oxygen Therapy: Patient Spontanous Breathing  Post-op Pain: none  Post-op Assessment: Post-op Vital signs reviewed, Patient's Cardiovascular Status Stable, No headache, No backache, No residual numbness and No residual motor weakness  Post-op Vital Signs: Reviewed and stable  Complications: No apparent anesthesia complications

## 2013-03-05 NOTE — Progress Notes (Signed)
Post Partum Day 1 Subjective: no complaints  Objective: Blood pressure 105/66, pulse 59, temperature 98.2 F (36.8 C), temperature source Oral, resp. rate 20, height 5\' 2"  (1.575 m), weight 155 lb (70.308 kg), last menstrual period 04/10/2012, SpO2 99.00%, unknown if currently breastfeeding.  Physical Exam:  General: alert Lochia: appropriate Uterine Fundus: firm   Recent Labs  03/04/13 1420 03/05/13 0645  HGB 11.7* 11.1*  HCT 34.1* 33.2*    Assessment/Plan: Plan for discharge tomorrow   LOS: 1 day   Gioia Ranes D 03/05/2013, 10:13 AM

## 2013-03-06 NOTE — Discharge Summary (Signed)
Obstetric Discharge Summary Reason for Admission: onset of labor Prenatal Procedures: ultrasound Intrapartum Procedures: spontaneous vaginal delivery Postpartum Procedures: none Complications-Operative and Postpartum: none Hemoglobin  Date Value Range Status  03/05/2013 11.1* 12.0 - 15.0 g/dL Final     HCT  Date Value Range Status  03/05/2013 33.2* 36.0 - 46.0 % Final    Physical Exam:  General: alert Lochia: appropriate Uterine Fundus: firm  Discharge Diagnoses: Term Pregnancy-delivered  Discharge Information: Date: 03/06/2013 Activity: pelvic rest Diet: routine Medications: PNV and Ibuprofen Condition: stable Instructions: refer to practice specific booklet Discharge to: home Follow-up Information   Follow up with Mickel Baas, MD. Schedule an appointment as soon as possible for a visit in 4 weeks.   Contact information:   [address] [place] [address] [phone]       Newborn Data: Live born female  Birth Weight: 7 lb 15.7 oz (3620 g) APGAR: 8, 9  Home with mother.  Rocky Gladden D 03/06/2013, 9:56 AM

## 2013-03-08 ENCOUNTER — Inpatient Hospital Stay (HOSPITAL_COMMUNITY): Admission: RE | Admit: 2013-03-08 | Payer: Managed Care, Other (non HMO) | Source: Ambulatory Visit

## 2013-08-25 ENCOUNTER — Encounter (INDEPENDENT_AMBULATORY_CARE_PROVIDER_SITE_OTHER): Payer: Self-pay

## 2013-08-25 ENCOUNTER — Telehealth: Payer: Self-pay | Admitting: Nurse Practitioner

## 2013-08-25 ENCOUNTER — Ambulatory Visit (INDEPENDENT_AMBULATORY_CARE_PROVIDER_SITE_OTHER): Payer: Managed Care, Other (non HMO) | Admitting: Family Medicine

## 2013-08-25 VITALS — BP 91/57 | HR 71 | Temp 98.9°F | Ht 61.5 in | Wt 154.0 lb

## 2013-08-25 DIAGNOSIS — B86 Scabies: Secondary | ICD-10-CM

## 2013-08-25 MED ORDER — PERMETHRIN 5 % EX CREA: TOPICAL_CREAM | Freq: Once | CUTANEOUS | Status: DC

## 2013-08-25 NOTE — Patient Instructions (Signed)
Scabies  Scabies are small bugs (mites) that burrow under the skin and cause red bumps and severe itching. These bugs can only be seen with a microscope. Scabies are highly contagious. They can spread easily from person to person by direct contact. They are also spread through sharing clothing or linens that have the scabies mites living in them. It is not unusual for an entire family to become infected through shared towels, clothing, or bedding.   HOME CARE INSTRUCTIONS   · Your caregiver may prescribe a cream or lotion to kill the mites. If cream is prescribed, massage the cream into the entire body from the neck to the bottom of both feet. Also massage the cream into the scalp and face if your child is less than 1 year old. Avoid the eyes and mouth. Do not wash your hands after application.  · Leave the cream on for 8 to 12 hours. Your child should bathe or shower after the 8 to 12 hour application period. Sometimes it is helpful to apply the cream to your child right before bedtime.  · One treatment is usually effective and will eliminate approximately 95% of infestations. For severe cases, your caregiver may decide to repeat the treatment in 1 week. Everyone in your household should be treated with one application of the cream.  · New rashes or burrows should not appear within 24 to 48 hours after successful treatment. However, the itching and rash may last for 2 to 4 weeks after successful treatment. Your caregiver may prescribe a medicine to help with the itching or to help the rash go away more quickly.  · Scabies can live on clothing or linens for up to 3 days. All of your child's recently used clothing, towels, stuffed toys, and bed linens should be washed in hot water and then dried in a dryer for at least 20 minutes on high heat. Items that cannot be washed should be enclosed in a plastic bag for at least 3 days.  · To help relieve itching, bathe your child in a cool bath or apply cool washcloths to the  affected areas.  · Your child may return to school after treatment with the prescribed cream.  SEEK MEDICAL CARE IF:   · The itching persists longer than 4 weeks after treatment.  · The rash spreads or becomes infected. Signs of infection include red blisters or yellow-tan crust.  Document Released: 10/13/2005 Document Revised: 01/05/2012 Document Reviewed: 02/21/2009  ExitCare® Patient Information ©2014 [employer].

## 2013-08-25 NOTE — Progress Notes (Signed)
  Subjective:    Patient ID: Haley Clements, female    DOB: 02/02/1981, 32 y.o.   MRN: [id number]  HPI Patient presents today for questionable scabies exposure. Patient reports that there was a major scabies outbreak at her daughter's school over the past 2-3 weeks. 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 Has noticed a lot of itching and irritation in the upper arms as well as hands. No fevers or chills. Mom ports there were no other family members with itching or rash thus far. Does report a baseline history of eczema that is intermittent and mild in nature. Rash and irritation is atypical from previous eczema flares. Review of Systems  All other systems reviewed and are negative.       Objective:   Physical Exam  Constitutional: She appears well-developed and well-nourished.  HENT:  Head: Normocephalic and atraumatic.  Eyes: Conjunctivae are normal. Pupils are equal, round, and reactive to light.  Neck: Normal range of motion.  Cardiovascular: Normal rate and regular rhythm.   Pulmonary/Chest: Effort normal and breath sounds normal.  Abdominal: Soft.  Neurological: She is alert.  Skin:     Mild focal erythematous lesions less than 0.5 cm on right upper arm as well as wrists bilaterally. No interdigital lesions of the hands.          Assessment & Plan:  Scabies - Plan: permethrin (ACTICIN) 5 % cream  We'll prescribe permethrin for treatment. Discussed use for her family as well as home cleaning. Handout given. Consider eczema treatment if symptoms persist despite treatment given prior history of eczema in the past, though this is less likely. Followup as needed

## 2013-08-25 NOTE — Telephone Encounter (Signed)
Appt given for today 

## 2013-12-05 ENCOUNTER — Other Ambulatory Visit: Payer: Managed Care, Other (non HMO) | Admitting: Nurse Practitioner

## 2013-12-21 ENCOUNTER — Encounter: Payer: Self-pay | Admitting: Nurse Practitioner

## 2013-12-21 ENCOUNTER — Telehealth: Payer: Self-pay | Admitting: Nurse Practitioner

## 2013-12-21 ENCOUNTER — Ambulatory Visit (INDEPENDENT_AMBULATORY_CARE_PROVIDER_SITE_OTHER): Payer: Managed Care, Other (non HMO) | Admitting: Nurse Practitioner

## 2013-12-21 VITALS — BP 126/72 | HR 80 | Temp 97.9°F | Ht 61.0 in | Wt 166.0 lb

## 2013-12-21 DIAGNOSIS — F411 Generalized anxiety disorder: Secondary | ICD-10-CM

## 2013-12-21 DIAGNOSIS — F3289 Other specified depressive episodes: Secondary | ICD-10-CM

## 2013-12-21 DIAGNOSIS — F329 Major depressive disorder, single episode, unspecified: Secondary | ICD-10-CM

## 2013-12-21 DIAGNOSIS — F32A Depression, unspecified: Secondary | ICD-10-CM

## 2013-12-21 MED ORDER — DULOXETINE HCL 60 MG PO CPEP: 60.0000 mg | ORAL_CAPSULE | Freq: Every day | ORAL | Status: DC

## 2013-12-21 NOTE — Patient Instructions (Signed)
Stress Management Stress is a state of physical or mental tension that often results from changes in your life or normal routine. Some common causes of stress are:  Death of a loved one.  Injuries or severe illnesses.  Getting fired or changing jobs.  Moving into a new home. Other causes may be:  Sexual problems.  Business or financial losses.  Taking on a large debt.  Regular conflict with someone at home or at work.  Constant tiredness from lack of sleep. It is not just bad things that are stressful. It may be stressful to:  Win the lottery.  Get married.  Buy a new car. The amount of stress that can be easily tolerated varies from person to person. Changes generally cause stress, regardless of the types of change. Too much stress can affect your health. It may lead to physical or emotional problems. Too little stress (boredom) may also become stressful. SUGGESTIONS TO REDUCE STRESS:  Talk things over with your family and friends. It often is helpful to share your concerns and worries. If you feel your problem is serious, you may want to get help from a professional counselor.  Consider your problems one at a time instead of lumping them all together. Trying to take care of everything at once may seem impossible. List all the things you need to do and then start with the most important one. Set a goal to accomplish 2 or 3 things each day. If you expect to do too many in a single day you will naturally fail, causing you to feel even more stressed.  Do not use alcohol or drugs to relieve stress. Although you may feel better for a short time, they do not remove the problems that caused the stress. They can also be habit forming.  Exercise regularly - at least 3 times per week. Physical exercise can help to relieve that "uptight" feeling and will relax you.  The shortest distance between despair and hope is often a good night's sleep.  Go to bed and get up on time allowing  yourself time for appointments without being rushed.  Take a short "time-out" period from any stressful situation that occurs during the day. Close your eyes and take some deep breaths. Starting with the muscles in your face, tense them, hold it for a few seconds, then relax. Repeat this with the muscles in your neck, shoulders, hand, stomach, back and legs.  Take good care of yourself. Eat a balanced diet and get plenty of rest.  Schedule time for having fun. Take a break from your daily routine to relax. HOME CARE INSTRUCTIONS   Call if you feel overwhelmed by your problems and feel you can no longer manage them on your own.  Return immediately if you feel like hurting yourself or someone else. Document Released: 04/08/2001 Document Revised: 01/05/2012 Document Reviewed: 06/07/2013 ExitCare Patient Information 2014 [employer].  

## 2013-12-21 NOTE — Telephone Encounter (Signed)
appt made

## 2013-12-21 NOTE — Progress Notes (Signed)
   Subjective:    Patient ID: Haley Clements, female    DOB: 1981-03-12, 33 y.o.   MRN: [id number]  HPI Patient in just recently had a baby- baby is now 209 months old- Mom is c/o depression and anxiety. SHe stays home with her 4 kids and is constantly under stress. Get supset easily and can't control emotions. Cries a lot and easily.    Review of Systems  Constitutional: Negative.   Respiratory: Negative.   Cardiovascular: Negative.   Gastrointestinal: Negative.   Musculoskeletal: Negative.   Psychiatric/Behavioral: Negative.   All other systems reviewed and are negative.       Objective:   Physical Exam  Constitutional: She is oriented to person, place, and time. She appears well-developed and well-nourished.  HENT:  Right Ear: External ear normal.  Left Ear: External ear normal.  Nose: Nose normal.  Mouth/Throat: Oropharynx is clear and moist.  Cardiovascular: Normal rate, regular rhythm and normal heart sounds.   Pulmonary/Chest: Effort normal and breath sounds normal.  Neurological: She is alert and oriented to person, place, and time.  Psychiatric: She has a normal mood and affect. Her behavior is normal. Judgment and thought content normal.   BP 126/72  Pulse 80  Temp(Src) 97.9 F (36.6 C) (Oral)  Ht 5\' 1"  (1.549 m)  Wt 166 lb (75.297 kg)  BMI 31.38 kg/m2        Assessment & Plan:   1. Depression   2. GAD (generalized anxiety disorder)    Meds ordered this encounter  Medications  . DULoxetine (CYMBALTA) 60 MG capsule    Sig: Take 1 capsule (60 mg total) by mouth daily.    Dispense:  30 capsule    Refill:  3    Order Specific Question:  Supervising Provider    Answer:  Ernestina PennaMOORE, DONALD W [[id number]   Stress management Exercise  Haley Daphine DeutscherMartin, FNP

## 2013-12-23 ENCOUNTER — Telehealth: Payer: Self-pay | Admitting: Family Medicine

## 2013-12-23 MED ORDER — DULOXETINE HCL 30 MG PO CPEP: 30.0000 mg | ORAL_CAPSULE | Freq: Every day | ORAL | Status: DC

## 2013-12-23 NOTE — Telephone Encounter (Signed)
We have no samples of cymbalta 30mg  [facility]

## 2013-12-23 NOTE — Telephone Encounter (Signed)
rx sent to pharmacy Tell patient to go ahead nad just take 30 mg for 1 month then change to 60mg  rx that she already has.

## 2014-06-22 ENCOUNTER — Telehealth: Payer: Self-pay | Admitting: Nurse Practitioner

## 2014-06-22 MED ORDER — DULOXETINE HCL 60 MG PO CPEP: 60.0000 mg | ORAL_CAPSULE | Freq: Every day | ORAL | Status: DC

## 2014-06-22 MED ORDER — DULOXETINE HCL 30 MG PO CPEP: 30.0000 mg | ORAL_CAPSULE | Freq: Every day | ORAL | Status: DC

## 2014-06-22 NOTE — Telephone Encounter (Signed)
I sent the Rx to the pharmacy.

## 2014-07-06 ENCOUNTER — Encounter: Payer: Self-pay | Admitting: Nurse Practitioner

## 2014-07-06 ENCOUNTER — Ambulatory Visit (INDEPENDENT_AMBULATORY_CARE_PROVIDER_SITE_OTHER): Payer: Managed Care, Other (non HMO) | Admitting: Nurse Practitioner

## 2014-07-06 VITALS — BP 100/69 | HR 84 | Temp 99.7°F | Ht 62.0 in | Wt 162.0 lb

## 2014-07-06 DIAGNOSIS — F411 Generalized anxiety disorder: Secondary | ICD-10-CM | POA: Insufficient documentation

## 2014-07-06 DIAGNOSIS — F32A Depression, unspecified: Secondary | ICD-10-CM | POA: Insufficient documentation

## 2014-07-06 DIAGNOSIS — F329 Major depressive disorder, single episode, unspecified: Secondary | ICD-10-CM | POA: Insufficient documentation

## 2014-07-06 DIAGNOSIS — F3289 Other specified depressive episodes: Secondary | ICD-10-CM

## 2014-07-06 MED ORDER — SERTRALINE HCL 100 MG PO TABS: 100.0000 mg | ORAL_TABLET | Freq: Every day | ORAL | Status: DC

## 2014-07-06 NOTE — Progress Notes (Signed)
   Subjective:    Patient ID: Haley Clements, female    DOB: Jul 04, 1981, 33 y.o.   MRN: [id number]  HPI Patient here for a follow up on chronic medical problems.  Depression is under control and currently taking Cymbalta  with good results.  Having more anxiety issues that is occuring when her children come in from school and having to help with all 4 children with homework, cooking dinner and not having any help from spouse.  Anxiety carries over to bedtime and having difficulty falling asleep each night.  Feel like she is not able to remember things due to feeling overwhelmed at times.  Depression Currently taking Cymbalta  1 qd and having good results on depression but not with anxiety  Review of Systems  Constitutional: Negative.   Respiratory: Negative.   Cardiovascular: Negative.   Skin: Negative.   Neurological: Negative.   Psychiatric/Behavioral: Negative.        Objective:   Physical Exam  Constitutional: She is oriented to person, place, and time. She appears well-developed and well-nourished.  Cardiovascular: Normal rate, regular rhythm and normal heart sounds.   Pulmonary/Chest: Effort normal and breath sounds normal.  Abdominal: Soft. Bowel sounds are normal.  Neurological: She is alert and oriented to person, place, and time. She has normal reflexes.  Skin: Skin is warm and dry.  Psychiatric: She has a normal mood and affect. Her behavior is normal. Judgment and thought content normal.    BP 100/69  Pulse 84  Temp(Src) 99.7 F (37.6 C) (Oral)  Ht  (1.575 m)  Wt 162 lb (73.483 kg)  BMI 29.62 kg/m2       Assessment & Plan:   1. Depression   2. GAD (generalized anxiety disorder)    Meds ordered this encounter  Medications  . sertraline (ZOLOFT) 100 MG tablet    Sig: Take 1 tablet (100 mg total) by mouth daily.    Dispense:  30 tablet    Refill:  3    Order Specific Question:  Supervising Provider    Answer:  Deborra Medina   Stop  cymbalta- switch to zoloft Bedtime ritual Stress mangagement Follow up in 1 month  Mary-Margaret Daphine Deutscher, FNP

## 2014-07-06 NOTE — Patient Instructions (Signed)
Stress and Stress Management Stress is a normal reaction to life events. It is what you feel when life demands more than you are used to or more than you can handle. Some stress can be useful. For example, the stress reaction can help you catch the last bus of the day, study for a test, or meet a deadline at work. But stress that occurs too often or for too long can cause problems. It can affect your emotional health and interfere with relationships and normal daily activities. Too much stress can weaken your immune system and increase your risk for physical illness. If you already have a medical problem, stress can make it worse. CAUSES  All sorts of life events may cause stress. An event that causes stress for one person may not be stressful for another person. Major life events commonly cause stress. These may be positive or negative. Examples include losing your job, moving into a new home, getting married, having a baby, or losing a loved one. Less obvious life events may also cause stress, especially if they occur day after day or in combination. Examples include working long hours, driving in traffic, caring for children, being in debt, or being in a difficult relationship. SIGNS AND SYMPTOMS Stress may cause emotional symptoms including, the following:  Anxiety. This is feeling worried, afraid, on edge, overwhelmed, or out of control.  Anger. This is feeling irritated or impatient.  Depression. This is feeling sad, down, helpless, or guilty.  Difficulty focusing, remembering, or making decisions. Stress may cause physical symptoms, including the following:   Aches and pains. These may affect your head, neck, back, stomach, or other areas of your body.  Tight muscles or clenched jaw.  Low energy or trouble sleeping. Stress may cause unhealthy behaviors, including the following:   Eating to feel better (overeating) or skipping meals.  Sleeping too little, too much, or both.  Working  too much or putting off tasks (procrastination).  Smoking, drinking alcohol, or using drugs to feel better. DIAGNOSIS  Stress is diagnosed through an assessment by your health care provider. Your health care provider will ask questions about your symptoms and any stressful life events.Your health care provider will also ask about your medical history and may order blood tests or other tests. Certain medical conditions and medicine can cause physical symptoms similar to stress. Mental illness can cause emotional symptoms and unhealthy behaviors similar to stress. Your health care provider may refer you to a mental health professional for further evaluation.  TREATMENT  Stress management is the recommended treatment for stress.The goals of stress management are reducing stressful life events and coping with stress in healthy ways.  Techniques for reducing stressful life events include the following:  Stress identification. Self-monitor for stress and identify what causes stress for you. These skills may help you to avoid some stressful events.  Time management. Set your priorities, keep a calendar of events, and learn to say "no." These tools can help you avoid making too many commitments. Techniques for coping with stress include the following:  Rethinking the problem. Try to think realistically about stressful events rather than ignoring them or overreacting. Try to find the positives in a stressful situation rather than focusing on the negatives.  Exercise. Physical exercise can release both physical and emotional tension. The key is to find a form of exercise you enjoy and do it regularly.  Relaxation techniques. These relax the body and mind. Examples include yoga, meditation, tai chi, biofeedback, deep  breathing, progressive muscle relaxation, listening to music, being out in nature, journaling, and other hobbies. Again, the key is to find one or more that you enjoy and can do  regularly.  Healthy lifestyle. Eat a balanced diet, get plenty of sleep, and do not smoke. Avoid using alcohol or drugs to relax.  Strong support network. Spend time with family, friends, or other people you enjoy being around.Express your feelings and talk things over with someone you trust. Counseling or talktherapy with a mental health professional may be helpful if you are having difficulty managing stress on your own. Medicine is typically not recommended for the treatment of stress.Talk to your health care provider if you think you need medicine for symptoms of stress. HOME CARE INSTRUCTIONS  Keep all follow-up visits as directed by your health care provider.  Take all medicines as directed by your health care provider. SEEK MEDICAL CARE IF:  Your symptoms get worse or you start having new symptoms.  You feel overwhelmed by your problems and can no longer manage them on your own. SEEK IMMEDIATE MEDICAL CARE IF:  You feel like hurting yourself or someone else. Document Released: 04/08/2001 Document Revised: 02/27/2014 Document Reviewed: 06/07/2013 ExitCare Patient Information 2015 [employer]. This information is not intended to replace advice given to you by your health care provider. Make sure you discuss any questions you have with your health care provider.  

## 2014-07-20 ENCOUNTER — Telehealth: Payer: Self-pay | Admitting: Nurse Practitioner

## 2014-07-20 MED ORDER — DULOXETINE HCL 60 MG PO CPEP: 60.0000 mg | ORAL_CAPSULE | Freq: Every day | ORAL | Status: DC

## 2014-07-20 MED ORDER — BUSPIRONE HCL 15 MG PO TABS: 15.0000 mg | ORAL_TABLET | Freq: Two times a day (BID) | ORAL | Status: DC

## 2014-07-20 NOTE — Telephone Encounter (Signed)
I sent the Rx to the pharmacy.

## 2014-07-21 NOTE — Telephone Encounter (Signed)
Aware ,scripts sent in. 

## 2014-08-28 ENCOUNTER — Encounter: Payer: Self-pay | Admitting: Nurse Practitioner

## 2014-10-31 ENCOUNTER — Other Ambulatory Visit: Payer: Self-pay | Admitting: Nurse Practitioner

## 2014-11-03 ENCOUNTER — Other Ambulatory Visit: Payer: Self-pay | Admitting: Nurse Practitioner

## 2014-12-18 ENCOUNTER — Other Ambulatory Visit: Payer: Self-pay | Admitting: Nurse Practitioner

## 2014-12-26 ENCOUNTER — Encounter: Payer: Self-pay | Admitting: Nurse Practitioner

## 2014-12-26 ENCOUNTER — Ambulatory Visit (INDEPENDENT_AMBULATORY_CARE_PROVIDER_SITE_OTHER): Payer: Managed Care, Other (non HMO) | Admitting: Nurse Practitioner

## 2014-12-26 VITALS — BP 121/85 | HR 84 | Temp 97.6°F | Ht 62.0 in | Wt 167.0 lb

## 2014-12-26 DIAGNOSIS — B373 Candidiasis of vulva and vagina: Secondary | ICD-10-CM

## 2014-12-26 DIAGNOSIS — Z124 Encounter for screening for malignant neoplasm of cervix: Secondary | ICD-10-CM | POA: Diagnosis not present

## 2014-12-26 DIAGNOSIS — Z01419 Encounter for gynecological examination (general) (routine) without abnormal findings: Secondary | ICD-10-CM

## 2014-12-26 DIAGNOSIS — F329 Major depressive disorder, single episode, unspecified: Secondary | ICD-10-CM

## 2014-12-26 DIAGNOSIS — N3000 Acute cystitis without hematuria: Secondary | ICD-10-CM

## 2014-12-26 DIAGNOSIS — F32A Depression, unspecified: Secondary | ICD-10-CM

## 2014-12-26 DIAGNOSIS — Z0000 Encounter for general adult medical examination without abnormal findings: Secondary | ICD-10-CM

## 2014-12-26 DIAGNOSIS — B3731 Acute candidiasis of vulva and vagina: Secondary | ICD-10-CM

## 2014-12-26 LAB — POCT URINALYSIS DIPSTICK
Bilirubin, UA: NEGATIVE
Glucose, UA: NEGATIVE
Ketones, UA: NEGATIVE
Nitrite, UA: NEGATIVE
PH UA: 6
PROTEIN UA: NEGATIVE
RBC UA: NEGATIVE
UROBILINOGEN UA: NEGATIVE

## 2014-12-26 LAB — POCT UA - MICROSCOPIC ONLY
CRYSTALS, UR, HPF, POC: NEGATIVE
Casts, Ur, LPF, POC: NEGATIVE
Yeast, UA: NEGATIVE

## 2014-12-26 MED ORDER — FLUCONAZOLE 150 MG PO TABS: ORAL_TABLET | ORAL | Status: DC

## 2014-12-26 MED ORDER — NITROFURANTOIN MONOHYD MACRO 100 MG PO CAPS: 100.0000 mg | ORAL_CAPSULE | Freq: Two times a day (BID) | ORAL | Status: DC

## 2014-12-26 MED ORDER — DULOXETINE HCL 60 MG PO CPEP: 60.0000 mg | ORAL_CAPSULE | Freq: Every day | ORAL | Status: DC

## 2014-12-26 NOTE — Addendum Note (Signed)
Addended by: Prescott GumLAND, Lamiya Naas M on: 12/26/2014 02:46 PM   Modules accepted: Kipp BroodSmartSet

## 2014-12-26 NOTE — Progress Notes (Signed)
   Subjective:    Patient ID: Haley Clements, female    DOB: 07/14/1981, 34 y.o.   MRN: [id number]  HPI  Patient is here today for annual well person exam with pap. No acute complaint.  GAD: Currently taking buspar and reported doing well. Feels like at time she is racing trying to get everything done.  DEPRESSION:  Currently taking Cymbalta and doing well. No adverse effect reported.     Review of Systems  Constitutional: Negative.   HENT: Negative.   Eyes: Negative.   Respiratory: Negative.   Cardiovascular: Negative.   Gastrointestinal: Negative.   Endocrine: Negative.   Genitourinary: Negative.   Musculoskeletal: Negative.   Skin: Negative.   Allergic/Immunologic: Negative.   Neurological: Negative.   Hematological: Negative.   Psychiatric/Behavioral: Negative.        Objective:   Physical Exam  Constitutional: She appears well-developed and well-nourished.  HENT:  Head: Normocephalic and atraumatic.  Eyes: Pupils are equal, round, and reactive to light.  Neck: Normal range of motion.  Cardiovascular: Normal rate and regular rhythm.   Pulmonary/Chest: Effort normal and breath sounds normal.  Abdominal: Soft. Bowel sounds are normal.  Musculoskeletal: Normal range of motion.  Neurological: She is alert.  Skin: Skin is warm.  Psychiatric: She has a normal mood and affect. Her behavior is normal. Judgment and thought content normal.    BP 121/85 mmHg  Pulse 84  Temp(Src) 97.6 F (36.4 C) (Oral)  Ht $R'5\' 2"'bv$  (1.575 m)  Wt 167 lb (75.751 kg)  BMI 30.54 kg/m2       Assessment & Plan:  1. Annual physical exam - POCT urinalysis dipstick - POCT UA - Microscopic Only - CBC - CMP14+EGFR - NMR, lipoprofile - Thyroid Panel With TSH  2. Encounter for cervical Pap smear with pelvic exam - Pap IG w/ reflex to HPV when ASC-U  3. Vaginal candidiasis - fluconazole (DIFLUCAN) 150 MG tablet; 1 po qwk X4 weeks  Dispense: 4 tablet; Refill: 0  4. Depression Stress   management - DULoxetine (CYMBALTA) 60 MG capsule; Take 1 capsule (60 mg total) by mouth daily.  Dispense: 30 capsule; Refill: 5  5.UTI Take medication as prescribe Cotton underwear Take shower not bath Cranberry juice, yogurt Force fluids AZO over the counter X2 days Culture pending RTO prn -macrobid $RemoveBefor'100mg'XyHAIjSdymLL$  1 po BID X7 days    Mary-Margaret Hassell Done, FNP

## 2014-12-26 NOTE — Patient Instructions (Signed)
Generalized Anxiety Disorder Generalized anxiety disorder (GAD) is a mental disorder. It interferes with life functions, including relationships, work, and school. GAD is different from normal anxiety, which everyone experiences at some point in their lives in response to specific life events and activities. Normal anxiety actually helps us prepare for and get through these life events and activities. Normal anxiety goes away after the event or activity is over.  GAD causes anxiety that is not necessarily related to specific events or activities. It also causes excess anxiety in proportion to specific events or activities. The anxiety associated with GAD is also difficult to control. GAD can vary from mild to severe. People with severe GAD can have intense waves of anxiety with physical symptoms (panic attacks).  SYMPTOMS The anxiety and worry associated with GAD are difficult to control. This anxiety and worry are related to many life events and activities and also occur more days than not for 6 months or longer. People with GAD also have three or more of the following symptoms (one or more in children):  Restlessness.   Fatigue.  Difficulty concentrating.   Irritability.  Muscle tension.  Difficulty sleeping or unsatisfying sleep. DIAGNOSIS GAD is diagnosed through an assessment by your health care provider. Your health care provider will ask you questions aboutyour mood,physical symptoms, and events in your life. Your health care provider may ask you about your medical history and use of alcohol or drugs, including prescription medicines. Your health care provider may also do a physical exam and blood tests. Certain medical conditions and the use of certain substances can cause symptoms similar to those associated with GAD. Your health care provider may refer you to a mental health specialist for further evaluation. TREATMENT The following therapies are usually used to treat GAD:    Medication. Antidepressant medication usually is prescribed for long-term daily control. Antianxiety medicines may be added in severe cases, especially when panic attacks occur.   Talk therapy (psychotherapy). Certain types of talk therapy can be helpful in treating GAD by providing support, education, and guidance. A form of talk therapy called cognitive behavioral therapy can teach you healthy ways to think about and react to daily life events and activities.  Stress managementtechniques. These include yoga, meditation, and exercise and can be very helpful when they are practiced regularly. A mental health specialist can help determine which treatment is best for you. Some people see improvement with one therapy. However, other people require a combination of therapies. Document Released: 02/07/2013 Document Revised: 02/27/2014 Document Reviewed: 02/07/2013 ExitCare Patient Information 2015 [employer]. This information is not intended to replace advice given to you by your health care provider. Make sure you discuss any questions you have with your health care provider.  

## 2014-12-27 LAB — CBC
HCT: 40.9 % (ref 34.0–46.6)
HEMOGLOBIN: 14 g/dL (ref 11.1–15.9)
MCH: 28.9 pg (ref 26.6–33.0)
MCHC: 34.2 g/dL (ref 31.5–35.7)
MCV: 84 fL (ref 79–97)
PLATELETS: 383 10*3/uL — AB (ref 150–379)
RBC: 4.85 x10E6/uL (ref 3.77–5.28)
RDW: 13.4 % (ref 12.3–15.4)
WBC: 12.1 10*3/uL — ABNORMAL HIGH (ref 3.4–10.8)

## 2014-12-27 LAB — CMP14+EGFR
A/G RATIO: 1.6 (ref 1.1–2.5)
ALK PHOS: 128 IU/L — AB (ref 39–117)
ALT: 33 IU/L — AB (ref 0–32)
AST: 16 IU/L (ref 0–40)
Albumin: 4.3 g/dL (ref 3.5–5.5)
BILIRUBIN TOTAL: 0.3 mg/dL (ref 0.0–1.2)
BUN / CREAT RATIO: 11 (ref 8–20)
BUN: 8 mg/dL (ref 6–20)
CHLORIDE: 102 mmol/L (ref 97–108)
CO2: 25 mmol/L (ref 18–29)
Calcium: 9.4 mg/dL (ref 8.7–10.2)
Creatinine, Ser: 0.7 mg/dL (ref 0.57–1.00)
GFR, EST AFRICAN AMERICAN: 133 mL/min/{1.73_m2} (ref >59)
GFR, EST NON AFRICAN AMERICAN: 115 mL/min/{1.73_m2} (ref >59)
Globulin, Total: 2.7 g/dL (ref 1.5–4.5)
Glucose: 81 mg/dL (ref 65–99)
POTASSIUM: 3.7 mmol/L (ref 3.5–5.2)
SODIUM: 141 mmol/L (ref 134–144)
Total Protein: 7 g/dL (ref 6.0–8.5)

## 2014-12-27 LAB — NMR, LIPOPROFILE
Cholesterol: 218 mg/dL — ABNORMAL HIGH (ref 100–199)
HDL CHOLESTEROL BY NMR: 47 mg/dL (ref >39)
HDL Particle Number: 34.4 umol/L (ref >=30.5)
LDL Particle Number: 1639 nmol/L — ABNORMAL HIGH (ref <1000)
LDL Size: 21.1 nm (ref >20.5)
LDL-C: 142 mg/dL — AB (ref 0–99)
LP-IR Score: 64 — ABNORMAL HIGH (ref <=45)
Small LDL Particle Number: 408 nmol/L (ref <=527)
TRIGLYCERIDES BY NMR: 143 mg/dL (ref 0–149)

## 2014-12-27 LAB — THYROID PANEL WITH TSH
FREE THYROXINE INDEX: 1.8 (ref 1.2–4.9)
T3 UPTAKE RATIO: 26 % (ref 24–39)
T4, Total: 7 ug/dL (ref 4.5–12.0)
TSH: 1.46 u[IU]/mL (ref 0.450–4.500)

## 2014-12-27 LAB — URINE CULTURE: ORGANISM ID, BACTERIA: NO GROWTH

## 2014-12-28 LAB — PAP IG W/ RFLX HPV ASCU: PAP SMEAR COMMENT: 0

## 2015-01-01 ENCOUNTER — Telehealth: Payer: Self-pay | Admitting: *Deleted

## 2015-01-01 NOTE — Telephone Encounter (Signed)
lmtcb regarding test results. 

## 2015-01-01 NOTE — Telephone Encounter (Signed)
-----   Message from [facility] Martin, FNP sent at 12/31/2014  4:16 PM EST ----- Wbc still elevated- normal for patient Kidney and liver function stable ldl particle numbers and LDL are elevated- low CVA risk- low fat diet and exercise- repeat in 1 year Thyroid normal PAP normal- repeat in 2 years

## 2015-01-12 ENCOUNTER — Other Ambulatory Visit: Payer: Self-pay | Admitting: Nurse Practitioner

## 2015-02-27 ENCOUNTER — Encounter: Payer: Self-pay | Admitting: Nurse Practitioner

## 2015-02-27 ENCOUNTER — Ambulatory Visit (INDEPENDENT_AMBULATORY_CARE_PROVIDER_SITE_OTHER): Payer: Managed Care, Other (non HMO) | Admitting: Nurse Practitioner

## 2015-02-27 VITALS — BP 120/85 | HR 78 | Temp 97.0°F | Ht 62.0 in | Wt 171.0 lb

## 2015-02-27 DIAGNOSIS — N949 Unspecified condition associated with female genital organs and menstrual cycle: Secondary | ICD-10-CM | POA: Diagnosis not present

## 2015-02-27 DIAGNOSIS — D72829 Elevated white blood cell count, unspecified: Secondary | ICD-10-CM

## 2015-02-27 DIAGNOSIS — N9089 Other specified noninflammatory disorders of vulva and perineum: Secondary | ICD-10-CM

## 2015-02-27 LAB — POCT CBC
GRANULOCYTE PERCENT: 60.7 % (ref 37–80)
HEMATOCRIT: 43.7 % (ref 37.7–47.9)
Hemoglobin: 14.2 g/dL (ref 12.2–16.2)
Lymph, poc: 3.3 (ref 0.6–3.4)
MCH: 27.9 pg (ref 27–31.2)
MCHC: 32.4 g/dL (ref 31.8–35.4)
MCV: 86.2 fL (ref 80–97)
MPV: 7.5 fL (ref 0–99.8)
POC Granulocyte: 6.3 (ref 2–6.9)
POC LYMPH PERCENT: 32.2 %L (ref 10–50)
Platelet Count, POC: 379 10*3/uL (ref 142–424)
RBC: 5.07 M/uL (ref 4.04–5.48)
RDW, POC: 12.9 %
WBC: 10.3 10*3/uL — AB (ref 4.6–10.2)

## 2015-02-27 MED ORDER — SULFAMETHOXAZOLE-TRIMETHOPRIM 800-160 MG PO TABS: 1.0000 | ORAL_TABLET | Freq: Two times a day (BID) | ORAL | Status: DC

## 2015-02-27 NOTE — Progress Notes (Signed)
   Subjective:    Patient ID: Haley Clements, female    DOB: 1981/04/02, 34 y.o.   MRN: [id number]  HPI Patient in today c/o a sore place on her perineum- noticed it Saturday night- pain only when she messes with it. No drainage.    Review of Systems  Constitutional: Positive for appetite change.  HENT: Negative.   Respiratory: Negative.   Cardiovascular: Negative.   Gastrointestinal: Negative.   Genitourinary: Negative.   Neurological: Negative.   Psychiatric/Behavioral: Negative.   All other systems reviewed and are negative.      Objective:   Physical Exam  Constitutional: She is oriented to person, place, and time. She appears well-developed and well-nourished.  Cardiovascular: Normal rate and normal heart sounds.   Pulmonary/Chest: Effort normal and breath sounds normal.  Genitourinary:  Tender area right next to clitorisis- questionable palpable nodule.  Neurological: She is alert and oriented to person, place, and time.  Skin: Skin is warm.  Psychiatric: She has a normal mood and affect. Her behavior is normal. Judgment and thought content normal.   BP 120/85 mmHg  Pulse 78  Temp(Src) 97 F (36.1 C) (Oral)  Ht 5\' 2"  (1.575 m)  Wt 171 lb (77.565 kg)  BMI 31.27 kg/m2        Assessment & Plan:   1. Perineal mass in female   2. Elevated WBC count    Orders Placed This Encounter  Procedures  . POCT CBC   Meds ordered this encounter  Medications  . sulfamethoxazole-trimethoprim (BACTRIM DS) 800-160 MG per tablet    Sig: Take 1 tablet by mouth 2 (two) times daily.    Dispense:  14 tablet    Refill:  0    Order Specific Question:  Supervising Provider    Answer:  Ernestina PennaMOORE, DONALD W [[id number]]   Watch area - if does not resolve will do referral  Mary-Margaret Daphine DeutscherMartin, FNP

## 2015-02-28 ENCOUNTER — Telehealth: Payer: Self-pay | Admitting: *Deleted

## 2015-02-28 NOTE — Telephone Encounter (Signed)
-----   Message from Bennie PieriniMary-Margaret Martin, FNP sent at 02/27/2015  6:37 PM EDT ----- Cbc normal

## 2015-06-01 ENCOUNTER — Ambulatory Visit (INDEPENDENT_AMBULATORY_CARE_PROVIDER_SITE_OTHER): Payer: Managed Care, Other (non HMO) | Admitting: Family Medicine

## 2015-06-01 ENCOUNTER — Encounter: Payer: Self-pay | Admitting: Family Medicine

## 2015-06-01 VITALS — BP 124/56 | HR 86 | Temp 99.0°F | Ht 62.0 in | Wt 176.2 lb

## 2015-06-01 DIAGNOSIS — F329 Major depressive disorder, single episode, unspecified: Secondary | ICD-10-CM

## 2015-06-01 DIAGNOSIS — R509 Fever, unspecified: Secondary | ICD-10-CM

## 2015-06-01 DIAGNOSIS — N3000 Acute cystitis without hematuria: Secondary | ICD-10-CM | POA: Diagnosis not present

## 2015-06-01 DIAGNOSIS — F32A Depression, unspecified: Secondary | ICD-10-CM

## 2015-06-01 LAB — POCT URINALYSIS DIPSTICK
BILIRUBIN UA: NEGATIVE
Blood, UA: NEGATIVE
Glucose, UA: NEGATIVE
Ketones, UA: NEGATIVE
Nitrite, UA: NEGATIVE
PROTEIN UA: NEGATIVE
Spec Grav, UA: 1.025
UROBILINOGEN UA: NEGATIVE
pH, UA: 5

## 2015-06-01 LAB — POCT UA - MICROSCOPIC ONLY
Bacteria, U Microscopic: NEGATIVE
CRYSTALS, UR, HPF, POC: NEGATIVE
Casts, Ur, LPF, POC: NEGATIVE
RBC, URINE, MICROSCOPIC: NEGATIVE
Yeast, UA: NEGATIVE

## 2015-06-01 MED ORDER — CEPHALEXIN 500 MG PO CAPS: 500.0000 mg | ORAL_CAPSULE | Freq: Three times a day (TID) | ORAL | Status: DC

## 2015-06-01 NOTE — Assessment & Plan Note (Addendum)
Symptoms reasonably consistent with acute cystitis, urinalysis is weakly positive today with positive leukocytes Will send for culture to be sure, however given patient's symptoms, subjective fever with chills, and the fact that it's Friday afternoon I wont get culture results until Monday I think is most prudent to treat at this time. Treat with Keflex, offered Diflucan and nausea medicine but she declines

## 2015-06-01 NOTE — Assessment & Plan Note (Signed)
PHQ-9 score 18 today, no SI She isnt taking cymbalta due to rash on her arms and she didn't like the feeling it gave her I offered a change but she declines, she will follow up as needed

## 2015-06-01 NOTE — Patient Instructions (Signed)
Great to meet you!  You may have a UTI, I think it is reasonable to try an antibiotic  I will send your urine for culture to make sure that the bacteria is susceptible to the treatment  Be sure to get plenty of fluids and call us if you get worse or fail to get better.

## 2015-06-01 NOTE — Progress Notes (Signed)
Patient ID: Shrinika Blatz, female   DOB: [birth year], 34 y.o.   MRN: [id number]   HPI  Patient presents today for acute visit for concern of UTI  Patient explains that over the last week or so she's had low back pain it's located in the paraspinal sacral area bilaterally. About 2-3 days ago she developed subjective fever, chills, nausea with nonbloody/non-bilious emesis 2. She still able tolerate food and fluid easily.  She denies dysuria, foul-smelling urine, blood in her urine  Depression Denies suicidal ideation Taking 1/3-1/2 of the BuSpar pill twice daily. Did not like the way Cymbalta made her feel, also states that she got dark areas on her forearms which she attributes to Cymbalta as it went away when she stopped the medication. Declines any additional or change in medications today.  PMH: Smoking status noted ROS: Per HPI, otherwise negative   Objective: BP 124/56 mmHg  Pulse 86  Temp(Src) 99 F (37.2 C) (Oral)  Ht  (1.575 m)  Wt 176 lb 3.2 oz (79.924 kg)  BMI 32.22 kg/m2 Gen: NAD, alert, cooperative with exam HEENT: NCAT CV: RRR, good S1/S2, no murmur Resp: CTABL, no wheezes, non-labored Abd: Soft, slight suprapubic tenderness to palpation Neuro: Alert and oriented, drink 5/5 in bilateral lower extremities, 2+ patellar reflexes  Assessment and plan:  Acute cystitis Symptoms reasonably consistent with acute cystitis, urinalysis is weakly positive today with positive leukocytes Will send for culture to be sure, however given patient's symptoms, subjective fever with chills, and the fact that it's Friday afternoon I wont get culture results until Monday I think is most prudent to treat at this time. Treat with Keflex, offered Diflucan and nausea medicine but she declines    Depression PHQ-9 score 18 today, no SI She isnt taking cymbalta due to rash on her arms and she didn't like the feeling it gave her I offered a change but she declines, she will follow up as  needed    Orders Placed This Encounter  Procedures  . Urine culture  . POCT urinalysis dipstick  . POCT UA - Microscopic Only    Meds ordered this encounter  Medications  . 5-Hydroxytryptophan (5-HTP) 100 MG CAPS    Sig: Take by mouth.  . cephALEXin (KEFLEX) 500 MG capsule    Sig: Take 1 capsule (500 mg total) by mouth 3 (three) times daily.    Dispense:  21 capsule    Refill:  0

## 2015-06-03 LAB — URINE CULTURE

## 2015-06-19 ENCOUNTER — Encounter: Payer: Self-pay | Admitting: Family Medicine

## 2015-06-19 ENCOUNTER — Ambulatory Visit (INDEPENDENT_AMBULATORY_CARE_PROVIDER_SITE_OTHER): Payer: Managed Care, Other (non HMO) | Admitting: Family Medicine

## 2015-06-19 DIAGNOSIS — F32A Depression, unspecified: Secondary | ICD-10-CM

## 2015-06-19 DIAGNOSIS — F329 Major depressive disorder, single episode, unspecified: Secondary | ICD-10-CM

## 2015-06-19 MED ORDER — LURASIDONE HCL 20 MG PO TABS: 20.0000 mg | ORAL_TABLET | Freq: Every day | ORAL | Status: DC

## 2015-06-19 NOTE — Assessment & Plan Note (Addendum)
She is screen positive for Depression, anxiety, and Bipolar disorder Most likely bipolar depression No SSRI with risk of mania, although low with good tolerance in the past Refer to psych Trial of latuda

## 2015-06-19 NOTE — Patient Instructions (Signed)
Great to see you!  After starting this medicine if you have any suicidal ideation please seek immediate medical attention  Come back in 3-4 weeks

## 2015-06-19 NOTE — Progress Notes (Signed)
Patient ID: Haley Clements, female   DOB: 15-Jun-1981, 34 y.o.   MRN: [id number]   HPI  Patient presents today for evaluation of her mood  Depression, anxiety Patient explains that she's had issues with irritability, depressed feelings, severe mood swings, and loss of sleep now for several weeks. She denies suicidal ideation She has family history of bipolar disorder She states she has non-3 days before without sleep and not felt tired. She has tried several second atrophic medications without improvement including Celexa, Ativan, propranolol, Lexapro, Cymbalta, and BuSpar She last tried Cymbalta and states that it made her feel floaty and not like herself, she could not tolerate it, it also made her feel sleepy. She does not want to try any other benzodiazepine use She states that she functions fine at work but does have severe limitations at home due to irritability  PMH: Smoking status noted ROS: Per HPI  Objective: BP 113/77 mmHg  Pulse 72  Temp(Src) 98 F (36.7 C) (Oral)  Ht _0  (1.575 m)  Wt 172 lb 9.6 oz (78.291 kg)  BMI 31.56 kg/m2 Gen: NAD, alert, cooperative with exam HEENT: NCAT Ext: No edema, warm Neuro: Alert and oriented, No gross deficits Psych: Appropriate mood and affect, tearful at times during the exam  Mood screening tools:  PHQ-9 score: 16, No SI GAD-7 Score: 17 MDQ was screen positive for bipolar disorder , 8 yes's, yes on #2, modeate problem, + fmaily Hx   Assessment and plan:  Depression She is screen positive for Depression, anxiety, and Bipolar disorder Most likely bipolar depression No SSRI with risk of mania, although low with good tolerance in the past Refer to psych Trial of latuda   Orders Placed This Encounter  Procedures  . Lipid Panel  . CMP14+EGFR  . CBC with Differential  . Ambulatory referral to Psychiatry    Referral Priority:  Routine    Referral Type:  Psychiatric    Referral Reason:  Specialty Services Required   Requested Specialty:  Psychiatry    Number of Visits Requested:  1    Meds ordered this encounter  Medications  . Lurasidone HCl 20 MG TABS    Sig: Take 1 tablet (20 mg total) by mouth daily.    Dispense:  30 tablet    Refill:  [place], MD [place] 06/19/2015, 5:27 PM

## 2015-06-20 ENCOUNTER — Telehealth: Payer: Self-pay | Admitting: Family Medicine

## 2015-06-20 DIAGNOSIS — F329 Major depressive disorder, single episode, unspecified: Secondary | ICD-10-CM

## 2015-06-20 DIAGNOSIS — F411 Generalized anxiety disorder: Secondary | ICD-10-CM

## 2015-06-20 DIAGNOSIS — F32A Depression, unspecified: Secondary | ICD-10-CM

## 2015-06-20 LAB — LIPID PANEL
CHOLESTEROL TOTAL: 199 mg/dL (ref 100–199)
Chol/HDL Ratio: 4.3 ratio units (ref 0.0–4.4)
HDL: 46 mg/dL (ref >39)
LDL CALC: 125 mg/dL — AB (ref 0–99)
TRIGLYCERIDES: 138 mg/dL (ref 0–149)
VLDL Cholesterol Cal: 28 mg/dL (ref 5–40)

## 2015-06-20 LAB — CMP14+EGFR
A/G RATIO: 1.7 (ref 1.1–2.5)
ALT: 14 IU/L (ref 0–32)
AST: 13 IU/L (ref 0–40)
Albumin: 4.4 g/dL (ref 3.5–5.5)
Alkaline Phosphatase: 88 IU/L (ref 39–117)
BUN/Creatinine Ratio: 12 (ref 8–20)
BUN: 8 mg/dL (ref 6–20)
Bilirubin Total: 0.8 mg/dL (ref 0.0–1.2)
CALCIUM: 9.2 mg/dL (ref 8.7–10.2)
CO2: 25 mmol/L (ref 18–29)
Chloride: 101 mmol/L (ref 97–108)
Creatinine, Ser: 0.65 mg/dL (ref 0.57–1.00)
GFR, EST AFRICAN AMERICAN: 136 mL/min/{1.73_m2} (ref >59)
GFR, EST NON AFRICAN AMERICAN: 118 mL/min/{1.73_m2} (ref >59)
GLOBULIN, TOTAL: 2.6 g/dL (ref 1.5–4.5)
Glucose: 84 mg/dL (ref 65–99)
POTASSIUM: 4.9 mmol/L (ref 3.5–5.2)
SODIUM: 141 mmol/L (ref 134–144)
Total Protein: 7 g/dL (ref 6.0–8.5)

## 2015-06-20 LAB — CBC WITH DIFFERENTIAL/PLATELET
BASOS: 1 %
Basophils Absolute: 0.1 10*3/uL (ref 0.0–0.2)
EOS (ABSOLUTE): 0.3 10*3/uL (ref 0.0–0.4)
Eos: 3 %
Hematocrit: 43 % (ref 34.0–46.6)
Hemoglobin: 14.2 g/dL (ref 11.1–15.9)
Immature Grans (Abs): 0 10*3/uL (ref 0.0–0.1)
Immature Granulocytes: 0 %
LYMPHS ABS: 3.4 10*3/uL — AB (ref 0.7–3.1)
Lymphs: 31 %
MCH: 28.5 pg (ref 26.6–33.0)
MCHC: 33 g/dL (ref 31.5–35.7)
MCV: 86 fL (ref 79–97)
MONOS ABS: 0.8 10*3/uL (ref 0.1–0.9)
Monocytes: 7 %
NEUTROS ABS: 6.5 10*3/uL (ref 1.4–7.0)
NEUTROS PCT: 58 %
PLATELETS: 353 10*3/uL (ref 150–379)
RBC: 4.98 x10E6/uL (ref 3.77–5.28)
RDW: 13 % (ref 12.3–15.4)
WBC: 11 10*3/uL — ABNORMAL HIGH (ref 3.4–10.8)

## 2015-06-20 NOTE — Telephone Encounter (Signed)
Referral sent to [facility] at 2:52 on 06/20/2015 while speaking to pt.  We no longer have access to this referral so I have told pt to f/u

## 2015-06-21 ENCOUNTER — Telehealth: Payer: Self-pay | Admitting: Family Medicine

## 2015-06-21 MED ORDER — QUETIAPINE FUMARATE 100 MG PO TABS: 100.0000 mg | ORAL_TABLET | Freq: Every day | ORAL | Status: DC

## 2015-06-21 NOTE — Telephone Encounter (Signed)
Called and left detailed message that rx pt requested had been sent to the pharmacy and how to take the rx and to CB with any further questions or concerns.

## 2015-06-21 NOTE — Telephone Encounter (Signed)
Referral written, Thanks!  Murtis Sink, MD [facility] 06/21/2015, 8:46 AM

## 2015-06-21 NOTE — Telephone Encounter (Signed)
Please advise 

## 2015-06-21 NOTE — Telephone Encounter (Signed)
Sent seroquel due to cost. patient should take 1/2 tab for 1 week then increase to full dose.   Murtis Sink, MD [facility] 06/21/2015, 12:27 PM

## 2015-06-21 NOTE — Addendum Note (Signed)
Addended by: Elenora Gamma on: 06/21/2015 08:46 AM   Modules accepted: Orders, Medications

## 2015-06-28 ENCOUNTER — Telehealth (HOSPITAL_COMMUNITY): Payer: Self-pay | Admitting: Licensed Clinical Social Worker

## 2015-07-04 NOTE — Telephone Encounter (Signed)
hswxf

## 2015-07-10 ENCOUNTER — Ambulatory Visit: Payer: Managed Care, Other (non HMO) | Admitting: Family Medicine

## 2015-07-12 ENCOUNTER — Ambulatory Visit (INDEPENDENT_AMBULATORY_CARE_PROVIDER_SITE_OTHER): Payer: Managed Care, Other (non HMO) | Admitting: Family Medicine

## 2015-07-12 ENCOUNTER — Encounter: Payer: Self-pay | Admitting: Family Medicine

## 2015-07-12 VITALS — BP 125/71 | HR 69 | Temp 97.7°F | Ht 62.0 in | Wt 173.8 lb

## 2015-07-12 DIAGNOSIS — F329 Major depressive disorder, single episode, unspecified: Secondary | ICD-10-CM | POA: Diagnosis not present

## 2015-07-12 DIAGNOSIS — F32A Depression, unspecified: Secondary | ICD-10-CM

## 2015-07-12 NOTE — Progress Notes (Signed)
   HPI  Patient presents today for follow-up for depression  We felt that she had bipolar depression started her on Seroquel. She is doing well. She states that her sleep is very much improved on Seroquel. She denies suicidal ideation.  She states that her depression is still present at a much lesser extent than previously. She feels improved overall.  She states that anxiety is still an issue.  She has an appointment with psychiatry tomorrow.  PMH: Smoking status noted ROS: Per HPI  Objective: BP 125/71 mmHg  Pulse 69  Temp(Src) 97.7 F (36.5 C) (Oral)  Ht  (1.575 m)  Wt 173 lb 12.8 oz (78.835 kg)  BMI 31.78 kg/m2 Gen: NAD, alert, cooperative with exam HEENT: NCAT, EOMI, PERRL CV: RRR, good S1/S2, no murmur Resp: CTABL, no wheezes, non-labored Ext: No edema, warm Neuro: Alert and oriented, No gross deficits PSych: No SI, Normal mood and affect  Assessment and plan:  # Bipolar depression Continue seroquel and buspar F/u with psychiatry repeat CMP and lipids in 6 months   Meds ordered this encounter  Medications  . busPIRone (BUSPAR) 15 MG tablet    Sig: Take 15 mg by mouth 3 (three) times daily.    Murtis Sink, MD [facility] 07/12/2015, 4:06 PM

## 2015-07-12 NOTE — Patient Instructions (Addendum)
Great to see you!  i am so glad you are improving  Please keep your appointment tomorrow.   Lets see you back in 6 months to repeat your labs.

## 2015-07-13 ENCOUNTER — Ambulatory Visit (INDEPENDENT_AMBULATORY_CARE_PROVIDER_SITE_OTHER): Payer: Managed Care, Other (non HMO) | Admitting: Psychology

## 2015-07-13 ENCOUNTER — Telehealth (HOSPITAL_COMMUNITY): Payer: Self-pay | Admitting: *Deleted

## 2015-07-13 DIAGNOSIS — F0630 Mood disorder due to known physiological condition, unspecified: Secondary | ICD-10-CM

## 2015-07-13 NOTE — Telephone Encounter (Signed)
Pt called office and left a voicemail stating she would office to call her back to sch 2wks appt with Dr. Kieth Brightly. Per pt, Dr. Kieth Brightly stated that she have to make a New Pt appt with Dr. Tenny Craw. Office called pt and LMTCB and number was provided.

## 2015-08-02 ENCOUNTER — Ambulatory Visit (HOSPITAL_COMMUNITY): Payer: Self-pay | Admitting: Psychology

## 2015-08-23 ENCOUNTER — Other Ambulatory Visit: Payer: Self-pay | Admitting: Family Medicine

## 2015-08-23 NOTE — Telephone Encounter (Signed)
Last seen 07/12/15  Dr Ermalinda MemosBradshaw

## 2015-08-27 ENCOUNTER — Ambulatory Visit (HOSPITAL_COMMUNITY): Payer: Self-pay | Admitting: Psychiatry

## 2015-08-28 ENCOUNTER — Ambulatory Visit (HOSPITAL_COMMUNITY): Payer: Managed Care, Other (non HMO) | Admitting: Psychiatry

## 2015-10-15 ENCOUNTER — Other Ambulatory Visit: Payer: Self-pay | Admitting: Family Medicine
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
## 2015-12-12 ENCOUNTER — Other Ambulatory Visit: Payer: Self-pay | Admitting: Family Medicine

## 2015-12-13 NOTE — Telephone Encounter (Signed)
Last seen 07/12/15  Dr Bradshaw 

## 2016-01-03 ENCOUNTER — Other Ambulatory Visit: Payer: Self-pay | Admitting: Nurse Practitioner

## 2016-01-03 NOTE — Telephone Encounter (Signed)
Last seen 07/17/15

## 2016-01-08 ENCOUNTER — Ambulatory Visit: Payer: Managed Care, Other (non HMO) | Admitting: Family Medicine

## 2016-01-11 ENCOUNTER — Ambulatory Visit (INDEPENDENT_AMBULATORY_CARE_PROVIDER_SITE_OTHER): Payer: Managed Care, Other (non HMO) | Admitting: Family Medicine

## 2016-01-11 ENCOUNTER — Encounter: Payer: Self-pay | Admitting: Family Medicine

## 2016-01-11 VITALS — BP 105/71 | HR 73 | Temp 97.6°F | Ht 62.0 in | Wt 172.6 lb

## 2016-01-11 DIAGNOSIS — E7800 Pure hypercholesterolemia, unspecified: Secondary | ICD-10-CM | POA: Diagnosis not present

## 2016-01-11 DIAGNOSIS — F32A Depression, unspecified: Secondary | ICD-10-CM

## 2016-01-11 DIAGNOSIS — F329 Major depressive disorder, single episode, unspecified: Secondary | ICD-10-CM | POA: Diagnosis not present

## 2016-01-11 NOTE — Progress Notes (Signed)
   HPI  Patient presents today here for follow-up depression.   She's been taking Seroquel now half a tablet for several months. She feels that it's lowered er mood and now she feels more depressed. She has not seen any improvement, however she is sleeping better and her anxiety os better  She has not been exercising and has excessive sleepiness She is taking 1/2 pill seroquel and rarely takes buspar   PMH: Smoking status noted ROS: Per HPI  Objective: BP 105/71 mmHg  Pulse 73  Temp(Src) 97.6 F (36.4 C) (Oral)  Ht 5\' 2"  (1.575 m)  Wt 172 lb 9.6 oz (78.291 kg)  BMI 31.56 kg/m2 Gen: NAD, alert, cooperative with exam HEENT: NCAT CV: RRR, good S1/S2, no murmur Resp: CTABL, no wheezes, non-labored Ext: No edema, warm Neuro: Alert and oriented, No gross deficits  Assessment and plan:  # Mood disorder, depreession Concern for BPD Recommended returning to bhh Continue seroquel DC buspar Labs  # Elevated LDL Lipid panel - non fasting   Murtis SinkSam Abbegail Matuska, MD [facility] 01/11/2016, 2:11 PM

## 2016-01-11 NOTE — Patient Instructions (Signed)
Great to see you!  I think the best thing is to give behavioral health another try.   Please call them back for an appointment

## 2016-01-12 LAB — CBC WITH DIFFERENTIAL/PLATELET
BASOS: 0 %
Basophils Absolute: 0 10*3/uL (ref 0.0–0.2)
EOS (ABSOLUTE): 0.2 10*3/uL (ref 0.0–0.4)
EOS: 3 %
Hematocrit: 43.1 % (ref 34.0–46.6)
Hemoglobin: 14.4 g/dL (ref 11.1–15.9)
IMMATURE GRANS (ABS): 0.1 10*3/uL (ref 0.0–0.1)
IMMATURE GRANULOCYTES: 1 %
LYMPHS: 30 %
Lymphocytes Absolute: 2.6 10*3/uL (ref 0.7–3.1)
MCH: 28.4 pg (ref 26.6–33.0)
MCHC: 33.4 g/dL (ref 31.5–35.7)
MCV: 85 fL (ref 79–97)
Monocytes Absolute: 0.5 10*3/uL (ref 0.1–0.9)
Monocytes: 6 %
NEUTROS PCT: 60 %
Neutrophils Absolute: 5.3 10*3/uL (ref 1.4–7.0)
Platelets: 311 10*3/uL (ref 150–379)
RBC: 5.07 x10E6/uL (ref 3.77–5.28)
RDW: 13.2 % (ref 12.3–15.4)
WBC: 8.7 10*3/uL (ref 3.4–10.8)

## 2016-01-12 LAB — CMP14+EGFR
ALT: 24 IU/L (ref 0–32)
AST: 11 IU/L (ref 0–40)
Albumin/Globulin Ratio: 1.7 (ref 1.2–2.2)
Albumin: 4.3 g/dL (ref 3.5–5.5)
Alkaline Phosphatase: 101 IU/L (ref 39–117)
BUN/Creatinine Ratio: 14 (ref 8–20)
BUN: 13 mg/dL (ref 6–20)
Bilirubin Total: 0.3 mg/dL (ref 0.0–1.2)
CALCIUM: 9.2 mg/dL (ref 8.7–10.2)
CO2: 23 mmol/L (ref 18–29)
CREATININE: 0.96 mg/dL (ref 0.57–1.00)
Chloride: 101 mmol/L (ref 96–106)
GFR calc Af Amer: 90 mL/min/{1.73_m2} (ref >59)
GFR, EST NON AFRICAN AMERICAN: 78 mL/min/{1.73_m2} (ref >59)
GLOBULIN, TOTAL: 2.5 g/dL (ref 1.5–4.5)
Glucose: 92 mg/dL (ref 65–99)
Potassium: 4.8 mmol/L (ref 3.5–5.2)
SODIUM: 138 mmol/L (ref 134–144)
Total Protein: 6.8 g/dL (ref 6.0–8.5)

## 2016-01-12 LAB — LIPID PANEL
CHOL/HDL RATIO: 5.4 ratio — AB (ref 0.0–4.4)
Cholesterol, Total: 216 mg/dL — ABNORMAL HIGH (ref 100–199)
HDL: 40 mg/dL (ref >39)
LDL Calculated: 133 mg/dL — ABNORMAL HIGH (ref 0–99)
Triglycerides: 213 mg/dL — ABNORMAL HIGH (ref 0–149)
VLDL CHOLESTEROL CAL: 43 mg/dL — AB (ref 5–40)

## 2016-01-12 LAB — TSH: TSH: 1.43 u[IU]/mL (ref 0.450–4.500)

## 2016-01-15 ENCOUNTER — Encounter: Payer: Self-pay | Admitting: *Deleted

## 2016-07-04 ENCOUNTER — Ambulatory Visit (INDEPENDENT_AMBULATORY_CARE_PROVIDER_SITE_OTHER): Payer: Managed Care, Other (non HMO) | Admitting: Family Medicine

## 2016-07-04 ENCOUNTER — Encounter: Payer: Self-pay | Admitting: Family Medicine

## 2016-07-04 VITALS — BP 114/76 | HR 79 | Temp 98.0°F | Ht 62.0 in | Wt 165.4 lb

## 2016-07-04 DIAGNOSIS — F329 Major depressive disorder, single episode, unspecified: Secondary | ICD-10-CM

## 2016-07-04 DIAGNOSIS — F411 Generalized anxiety disorder: Secondary | ICD-10-CM

## 2016-07-04 DIAGNOSIS — E669 Obesity, unspecified: Secondary | ICD-10-CM | POA: Insufficient documentation

## 2016-07-04 DIAGNOSIS — E7800 Pure hypercholesterolemia, unspecified: Secondary | ICD-10-CM | POA: Diagnosis not present

## 2016-07-04 DIAGNOSIS — F32A Depression, unspecified: Secondary | ICD-10-CM

## 2016-07-04 MED ORDER — QUETIAPINE FUMARATE 50 MG PO TABS: 50.0000 mg | ORAL_TABLET | Freq: Every day | ORAL | 3 refills | Status: DC

## 2016-07-04 NOTE — Progress Notes (Signed)
   HPI  Patient presents today for follow-up for depression/anxiety, elevated cholesterol, overweight.  Depression anxiety Seem to be well controlled with Seroquel It also helps her sleep. Denies suicidal thoughts. She does have occasional controlled anxiety and hypersomnolence She does not feel that hypersomnolence is caused by Seroquel, it was occurring previously as well.  Elevated cholesterol Patient has positive family history of heart attack and diabetes, however she is not watching her diet and not exercising regularly. She would like to be checked for high cholesterol as well as screened for diabetes.   PMH: Smoking status noted ROS: Per HPI  Objective: BP 114/76   Pulse 79   Temp 98 F (36.7 C) (Oral)   Ht '5\' 2"'$  (1.575 m)   Wt 165 lb 6.4 oz (75 kg)   BMI 30.25 kg/m  Gen: NAD, alert, cooperative with exam HEENT: NCAT CV: RRR, good S1/S2, no murmur Resp: CTABL, no wheezes, non-labored Ext: No edema, warm Neuro: Alert and oriented, No gross deficits  Psych: Appropriate mood and affect, denies suicidal thoughts  Assessment and plan:  # GAD, depression Reasonably well controlled on Seroquel, continue 50 mg every night Offered tablet SSRI for additional control, however she would like to defer. She does not want to psychiatry at this time  # Elevated LDL Repeat lipid panel Discussed therapeutic lifestyle   # Obesity Patient has lost some weight Encouraged therapeutic loss changes     Orders Placed This Encounter  Procedures  . Lipid panel  . CMP14+EGFR  . TSH    Meds ordered this encounter  Medications  . QUEtiapine (SEROQUEL) 50 MG tablet    Sig: Take 1 tablet (50 mg total) by mouth at bedtime.    Dispense:  90 tablet    Refill:  3    CYCLE FILL MEDICATION. Authorization is required for next refill.  Marland Kitchen levonorgestrel (MIRENA) 20 MCG/24HR IUD    Sig: 1 each by Intrauterine route once.    Laroy Apple, MD [facility] 07/04/2016, 3:53 PM

## 2016-07-04 NOTE — Patient Instructions (Signed)
Great to see you!  We will call with lab results within 1 week (or I will send them on mychart)  Lets follow up n 6 months  Shoot for 100 minutes of aerobic exercise per week

## 2016-07-05 LAB — CBC WITH DIFFERENTIAL/PLATELET
BASOS: 0 %
Basophils Absolute: 0 10*3/uL (ref 0.0–0.2)
EOS (ABSOLUTE): 0.2 10*3/uL (ref 0.0–0.4)
Eos: 2 %
HEMATOCRIT: 42.1 % (ref 34.0–46.6)
HEMOGLOBIN: 14.4 g/dL (ref 11.1–15.9)
IMMATURE GRANS (ABS): 0 10*3/uL (ref 0.0–0.1)
Immature Granulocytes: 0 %
LYMPHS: 32 %
Lymphocytes Absolute: 2.9 10*3/uL (ref 0.7–3.1)
MCH: 29.3 pg (ref 26.6–33.0)
MCHC: 34.2 g/dL (ref 31.5–35.7)
MCV: 86 fL (ref 79–97)
MONOCYTES: 7 %
Monocytes Absolute: 0.6 10*3/uL (ref 0.1–0.9)
NEUTROS ABS: 5.3 10*3/uL (ref 1.4–7.0)
Neutrophils: 59 %
Platelets: 295 10*3/uL (ref 150–379)
RBC: 4.91 x10E6/uL (ref 3.77–5.28)
RDW: 13.1 % (ref 12.3–15.4)
WBC: 9.1 10*3/uL (ref 3.4–10.8)

## 2016-07-05 LAB — LIPID PANEL
CHOLESTEROL TOTAL: 204 mg/dL — AB (ref 100–199)
Chol/HDL Ratio: 4 ratio units (ref 0.0–4.4)
HDL: 51 mg/dL (ref >39)
LDL Calculated: 130 mg/dL — ABNORMAL HIGH (ref 0–99)
Triglycerides: 115 mg/dL (ref 0–149)
VLDL Cholesterol Cal: 23 mg/dL (ref 5–40)

## 2016-07-05 LAB — CMP14+EGFR
ALBUMIN: 4.6 g/dL (ref 3.5–5.5)
ALT: 29 IU/L (ref 0–32)
AST: 22 IU/L (ref 0–40)
Albumin/Globulin Ratio: 1.8 (ref 1.2–2.2)
Alkaline Phosphatase: 95 IU/L (ref 39–117)
BILIRUBIN TOTAL: 0.5 mg/dL (ref 0.0–1.2)
BUN / CREAT RATIO: 21 (ref 9–23)
BUN: 15 mg/dL (ref 6–20)
CO2: 22 mmol/L (ref 18–29)
CREATININE: 0.7 mg/dL (ref 0.57–1.00)
Calcium: 9.5 mg/dL (ref 8.7–10.2)
Chloride: 103 mmol/L (ref 96–106)
GFR calc non Af Amer: 114 mL/min/{1.73_m2} (ref >59)
GFR, EST AFRICAN AMERICAN: 132 mL/min/{1.73_m2} (ref >59)
GLOBULIN, TOTAL: 2.5 g/dL (ref 1.5–4.5)
GLUCOSE: 86 mg/dL (ref 65–99)
Potassium: 4.7 mmol/L (ref 3.5–5.2)
SODIUM: 143 mmol/L (ref 134–144)
TOTAL PROTEIN: 7.1 g/dL (ref 6.0–8.5)

## 2016-07-05 LAB — TSH: TSH: 0.596 u[IU]/mL (ref 0.450–4.500)

## 2016-12-24 ENCOUNTER — Ambulatory Visit: Payer: Managed Care, Other (non HMO) | Admitting: Family Medicine

## 2016-12-25 ENCOUNTER — Encounter: Payer: Self-pay | Admitting: Family Medicine

## 2016-12-26 ENCOUNTER — Ambulatory Visit (INDEPENDENT_AMBULATORY_CARE_PROVIDER_SITE_OTHER): Payer: Managed Care, Other (non HMO) | Admitting: Family

## 2016-12-26 ENCOUNTER — Encounter: Payer: Self-pay | Admitting: Family

## 2016-12-26 VITALS — BP 111/71 | HR 103 | Temp 97.9°F | Ht 62.0 in | Wt 168.2 lb

## 2016-12-26 DIAGNOSIS — N764 Abscess of vulva: Secondary | ICD-10-CM

## 2016-12-26 MED ORDER — CEPHALEXIN 500 MG PO CAPS
500.0000 mg | ORAL_CAPSULE | Freq: Three times a day (TID) | ORAL | 0 refills | Status: DC
Start: 2016-12-26 — End: 2016-12-29

## 2016-12-26 NOTE — Patient Instructions (Signed)
Skin Abscess A skin abscess is an infected area on or under your skin that contains a collection of pus and other material. An abscess may also be called a furuncle, carbuncle, or boil. An abscess can occur in or on almost any part of your body. Some abscesses break open (rupture) on their own. Most continue to get worse unless they are treated. The infection can spread deeper into the body and eventually into your blood, which can make you feel ill. Treatment usually involves draining the abscess. What are the causes? An abscess occurs when germs, often bacteria, pass through your skin and cause an infection. This may be caused by:  A scrape or cut on your skin.  A puncture wound through your skin, including a needle injection.  Blocked oil or sweat glands.  Blocked and infected hair follicles.  A cyst that forms beneath your skin (sebaceous cyst) and becomes infected. What increases the risk? This condition is more likely to develop in people who:  Have a weak body defense system (immune system).  Have diabetes.  Have dry and irritated skin.  Get frequent injections or use illegal IV drugs.  Have a foreign body in a wound, such as a splinter.  Have problems with their lymph system or veins. What are the signs or symptoms? An abscess may start as a painful, firm bump under the skin. Over time, the abscess may get larger or become softer. Pus may appear at the top of the abscess, causing pressure and pain. It may eventually break through the skin and drain. Other symptoms include:  Redness.  Warmth.  Swelling.  Tenderness.  A sore on the skin. How is this diagnosed? This condition is diagnosed based on your medical history and a physical exam. A sample of pus may be taken from the abscess to find out what is causing the infection and what antibiotics can be used to treat it. You also may have:  Blood tests to look for signs of infection or spread of an infection to your  blood.  Imaging studies such as ultrasound, CT scan, or MRI if the abscess is deep. How is this treated? Small abscesses that drain on their own may not need treatment. Treatment for an abscess that does not rupture on its own may include:  Warm compresses applied to the area several times per day.  Incision and drainage. Your health care provider will make an incision to open the abscess and will remove pus and any foreign body or dead tissue. The incision area may be packed with gauze to keep it open for a few days while it heals.  Antibiotic medicines to treat infection. For a severe abscess, you may first get antibiotics through an IV and then change to oral antibiotics. Follow these instructions at home: Abscess Care   If you have an abscess that has not drained, place a warm, clean, wet washcloth over the abscess several times a day. Do this as told by your health care provider.  Follow instructions from your health care provider about how to take care of your abscess. Make sure you:  Cover the abscess with a bandage (dressing).  Change your dressing or gauze as told by your health care provider.  Wash your hands with soap and water before you change the dressing or gauze. If soap and water are not available, use hand sanitizer.  Check your abscess every day for signs of a worsening infection. Check for:  More redness, swelling, or   pain.  More fluid or blood.  Warmth.  More pus or a bad smell. Medicines   Take over-the-counter and prescription medicines only as told by your health care provider.  If you were prescribed an antibiotic medicine, take it as told by your health care provider. Do not stop taking the antibiotic even if you start to feel better. General instructions   To avoid spreading the infection:  Do not share personal care items, towels, or hot tubs with others.  Avoid making skin contact with other people.  Keep all follow-up visits as told by your  health care provider. This is important. Contact a health care provider if:  You have more redness, swelling, or pain around your abscess.  You have more fluid or blood coming from your abscess.  Your abscess feels warm to the touch.  You have more pus or a bad smell coming from your abscess.  You have a fever.  You have muscle aches.  You have chills or a general ill feeling. Get help right away if:  You have severe pain.  You see red streaks on your skin spreading away from the abscess. This information is not intended to replace advice given to you by your health care provider. Make sure you discuss any questions you have with your health care provider. Document Released: 07/23/2005 Document Revised: 06/08/2016 Document Reviewed: 08/22/2015 Elsevier Interactive Patient Education  2017 Elsevier Inc.  

## 2016-12-26 NOTE — Progress Notes (Signed)
   Subjective:    Patient ID: Haley Clements, female    DOB: 11-Feb-1981, 36 y.o.   MRN: [id number]  HPI PT presents to the office today with an abscess  on her right labia that is worse. Pt states she noticed it three days ago and it started draining a "yellow bloody" discharge yesterday. PT states the area is more swollen and tender today. PT has tried epsom salt with no relief.    Review of Systems  Skin: Positive for wound.  All other systems reviewed and are negative.      Objective:   Physical Exam  Constitutional: She appears well-developed and well-nourished.  Cardiovascular: Normal rate, regular rhythm, normal heart sounds and intact distal pulses.   Pulmonary/Chest: Effort normal and breath sounds normal.  Genitourinary: There is tenderness (enlarged, with purlent serous drainage, hard and warm to touch) on the right labia.    BP 111/71   Pulse (!) 103   Temp 97.9 F (36.6 C) (Oral)   Ht 5\' 2"  (1.575 m)   Wt 168 lb 3.2 oz (76.3 kg)   BMI 30.76 kg/m       Assessment & Plan:  1. Abscess of labia -Warm compresses -Do not pick or squeeze -Keep clean and dry -RTO if symptoms do not improve - cephALEXin (KEFLEX) 500 MG capsule; Take 1 capsule (500 mg total) by mouth 3 (three) times daily.  Dispense: 21 capsule; Refill: 0   Jannifer Rodneyhristy Hawks, FNP

## 2016-12-28 ENCOUNTER — Encounter: Payer: Self-pay | Admitting: Family

## 2016-12-29 ENCOUNTER — Encounter: Payer: Self-pay | Admitting: Family

## 2016-12-29 ENCOUNTER — Ambulatory Visit (INDEPENDENT_AMBULATORY_CARE_PROVIDER_SITE_OTHER): Payer: Managed Care, Other (non HMO) | Admitting: Family

## 2016-12-29 VITALS — BP 114/82 | HR 79 | Temp 98.0°F | Ht 62.0 in | Wt 161.2 lb

## 2016-12-29 DIAGNOSIS — N764 Abscess of vulva: Secondary | ICD-10-CM

## 2016-12-29 MED ORDER — FLUCONAZOLE 150 MG PO TABS: 150.0000 mg | ORAL_TABLET | ORAL | 0 refills | Status: DC | PRN

## 2016-12-29 MED ORDER — CEFTRIAXONE SODIUM 1 G IJ SOLR
1.0000 g | Freq: Once | INTRAMUSCULAR | Status: AC
  Administered 2016-12-29: 1 g via INTRAMUSCULAR

## 2016-12-29 MED ORDER — SULFAMETHOXAZOLE-TRIMETHOPRIM 800-160 MG PO TABS: 1.0000 | ORAL_TABLET | Freq: Two times a day (BID) | ORAL | 0 refills | Status: DC

## 2016-12-29 NOTE — Addendum Note (Signed)
Addended by: Jannifer RodneyHAWKS, Emily Massar A on: 12/29/2016 12:49 PM   Modules accepted: Orders

## 2016-12-29 NOTE — Progress Notes (Addendum)
   Subjective:    Patient ID: Haley Clements, female    DOB: 1981/05/07, 36 y.o.   MRN: [id number]  PT presents to the office today to recheck abscess of right labia. Pt was seen on 12/26/16 and started on keflex 500 mg TID. Pt reports she continues to have pain and tenderness. Pt states it continues to drain serous purulent discharge.   Otalgia   There is pain in the left ear. This is a new problem. The current episode started in the past 7 days. The problem occurs every few minutes. The problem has been waxing and waning. There has been no fever. The pain is at a severity of 2/10. The pain is moderate. She has tried acetaminophen for the symptoms. The treatment provided mild relief.        Review of Systems  HENT: Positive for ear pain.   Skin: Positive for wound.  All other systems reviewed and are negative.      Objective:   Physical Exam  Constitutional: She is oriented to person, place, and time. She appears well-developed and well-nourished. No distress.  HENT:  Head: Normocephalic and atraumatic.  Right Ear: External ear normal.  Left Ear: External ear normal.  Mouth/Throat: Oropharynx is clear and moist.  Cardiovascular: Normal rate, regular rhythm, normal heart sounds and intact distal pulses.   No murmur heard. Pulmonary/Chest: Effort normal and breath sounds normal. No respiratory distress. She has no wheezes.  Abdominal: Soft. Bowel sounds are normal. She exhibits no distension. There is no tenderness.  Genitourinary:    There is tenderness (warm, tenderness, swelling) on the right labia.  Musculoskeletal: Normal range of motion. She exhibits no edema or tenderness.  Neurological: She is alert and oriented to person, place, and time.  Skin: Skin is warm and dry.  Psychiatric: She has a normal mood and affect. Her behavior is normal. Judgment and thought content normal.  Vitals reviewed.    BP 114/82   Pulse 79   Temp 98 F (36.7 C) (Oral)   Ht 5\' 2"  (1.575 m)    Wt 161 lb 3.2 oz (73.1 kg)   BMI 29.48 kg/m      Assessment & Plan:  1. Abscess of labia -Stop Keflex and start Bactrim today Will give Rocephin given today RTO in 3 days Warm compresses and hot baths - cefTRIAXone (ROCEPHIN) injection 1 g; Inject 1 g into the muscle once. - sulfamethoxazole-trimethoprim (BACTRIM DS) 800-160 MG tablet; Take 1 tablet by mouth 2 (two) times daily.  Dispense: 14 tablet; Refill: 0   Jannifer Rodneyhristy Kathy Wahid, FNP

## 2016-12-29 NOTE — Patient Instructions (Signed)
Skin Abscess A skin abscess is an infected area on or under your skin that contains a collection of pus and other material. An abscess may also be called a furuncle, carbuncle, or boil. An abscess can occur in or on almost any part of your body. Some abscesses break open (rupture) on their own. Most continue to get worse unless they are treated. The infection can spread deeper into the body and eventually into your blood, which can make you feel ill. Treatment usually involves draining the abscess. What are the causes? An abscess occurs when germs, often bacteria, pass through your skin and cause an infection. This may be caused by:  A scrape or cut on your skin.  A puncture wound through your skin, including a needle injection.  Blocked oil or sweat glands.  Blocked and infected hair follicles.  A cyst that forms beneath your skin (sebaceous cyst) and becomes infected. What increases the risk? This condition is more likely to develop in people who:  Have a weak body defense system (immune system).  Have diabetes.  Have dry and irritated skin.  Get frequent injections or use illegal IV drugs.  Have a foreign body in a wound, such as a splinter.  Have problems with their lymph system or veins. What are the signs or symptoms? An abscess may start as a painful, firm bump under the skin. Over time, the abscess may get larger or become softer. Pus may appear at the top of the abscess, causing pressure and pain. It may eventually break through the skin and drain. Other symptoms include:  Redness.  Warmth.  Swelling.  Tenderness.  A sore on the skin. How is this diagnosed? This condition is diagnosed based on your medical history and a physical exam. A sample of pus may be taken from the abscess to find out what is causing the infection and what antibiotics can be used to treat it. You also may have:  Blood tests to look for signs of infection or spread of an infection to your  blood.  Imaging studies such as ultrasound, CT scan, or MRI if the abscess is deep. How is this treated? Small abscesses that drain on their own may not need treatment. Treatment for an abscess that does not rupture on its own may include:  Warm compresses applied to the area several times per day.  Incision and drainage. Your health care provider will make an incision to open the abscess and will remove pus and any foreign body or dead tissue. The incision area may be packed with gauze to keep it open for a few days while it heals.  Antibiotic medicines to treat infection. For a severe abscess, you may first get antibiotics through an IV and then change to oral antibiotics. Follow these instructions at home: Abscess Care   If you have an abscess that has not drained, place a warm, clean, wet washcloth over the abscess several times a day. Do this as told by your health care provider.  Follow instructions from your health care provider about how to take care of your abscess. Make sure you:  Cover the abscess with a bandage (dressing).  Change your dressing or gauze as told by your health care provider.  Wash your hands with soap and water before you change the dressing or gauze. If soap and water are not available, use hand sanitizer.  Check your abscess every day for signs of a worsening infection. Check for:  More redness, swelling, or   pain.  More fluid or blood.  Warmth.  More pus or a bad smell. Medicines   Take over-the-counter and prescription medicines only as told by your health care provider.  If you were prescribed an antibiotic medicine, take it as told by your health care provider. Do not stop taking the antibiotic even if you start to feel better. General instructions   To avoid spreading the infection:  Do not share personal care items, towels, or hot tubs with others.  Avoid making skin contact with other people.  Keep all follow-up visits as told by your  health care provider. This is important. Contact a health care provider if:  You have more redness, swelling, or pain around your abscess.  You have more fluid or blood coming from your abscess.  Your abscess feels warm to the touch.  You have more pus or a bad smell coming from your abscess.  You have a fever.  You have muscle aches.  You have chills or a general ill feeling. Get help right away if:  You have severe pain.  You see red streaks on your skin spreading away from the abscess. This information is not intended to replace advice given to you by your health care provider. Make sure you discuss any questions you have with your health care provider. Document Released: 07/23/2005 Document Revised: 06/08/2016 Document Reviewed: 08/22/2015 Elsevier Interactive Patient Education  2017 Elsevier Inc.  

## 2017-06-16 ENCOUNTER — Encounter: Payer: Self-pay | Admitting: Family Medicine

## 2017-06-16 ENCOUNTER — Ambulatory Visit (INDEPENDENT_AMBULATORY_CARE_PROVIDER_SITE_OTHER): Payer: Managed Care, Other (non HMO) | Admitting: Family Medicine

## 2017-06-16 VITALS — BP 108/74 | HR 76 | Temp 99.4°F | Ht 62.0 in | Wt 160.4 lb

## 2017-06-16 DIAGNOSIS — Z8669 Personal history of other diseases of the nervous system and sense organs: Secondary | ICD-10-CM

## 2017-06-16 DIAGNOSIS — H539 Unspecified visual disturbance: Secondary | ICD-10-CM | POA: Diagnosis not present

## 2017-06-16 DIAGNOSIS — F411 Generalized anxiety disorder: Secondary | ICD-10-CM

## 2017-06-16 MED ORDER — RISPERIDONE 0.5 MG PO TABS: 0.5000 mg | ORAL_TABLET | Freq: Every day | ORAL | 1 refills | Status: DC

## 2017-06-16 NOTE — Progress Notes (Signed)
HPI  Patient presents today here to follow-up for chronic medical conditions.  Anxiety, patient was previously treated with Cymbalta and was not having good effect. At the time she screened positive for bipolar disorder. She has been diagnosed with PTSD by psychiatry before. She denies SI. She states that she feels symptoms of mania and irritability coming on about an hour before her Seroquel is new. She states that Seroquel does cause her to fall asleep most the time, however she wakes up frequently with a very groggy feeling and has difficulty falling back asleep.  Patient also complains of frequent episodes of gray blurry vision recently. She states this happened when she was a child and he was followed by grand mal seizure. She was treated with Depakote for a time for seizure disorder and stopped when she was a young child. She denies any weakness, numbness, tingling, or recent head injury.  PMH: Smoking status noted ROS: Per HPI  Objective: BP 108/74   Pulse 76   Temp 99.4 F (37.4 C) (Oral)   Ht '5\' 2"'$  (1.575 m)   Wt 160 lb 6.4 oz (72.8 kg)   BMI 29.34 kg/m  Gen: NAD, alert, cooperative with exam HEENT: NCAT, EOMI, PERRL CV: RRR, good S1/S2, no murmur Resp: CTABL, no wheezes, non-labored Ext: No edema, warm Neuro: Alert and oriented, no nerves II through XII intact, strength 5/5 and sensation intact in all 4 extremities.  Depression screen [facility] 2/9 06/16/2017 12/29/2016 12/26/2016 07/04/2016 01/11/2016  Decreased Interest '2 2 2 2 3  '$ Down, Depressed, Hopeless 2 0 '1 1 3  '$ PHQ - 2 Score '4 2 3 3 6  '$ Altered sleeping '3 3 3 2 3  '$ Tired, decreased energy '3 3 3 3 3  '$ Change in appetite '2 2 2 2 3  '$ Feeling bad or failure about yourself  1 0 0 1 3  Trouble concentrating '3 1 1 1 1  '$ Moving slowly or fidgety/restless 0 0 0 3 1  Suicidal thoughts 0 0 0 0 0  PHQ-9 Score '16 11 12 15 20  '$ Difficult doing work/chores Somewhat difficult - - Somewhat difficult -     Assessment and plan:  #  Generalized anxiety disorder Patient previously diagnosed with PTSD from psychiatry, she was screen positive on the MDQ which caused my transition from an SNRi to Seroquel.  At the time I recommended psychiatry, she was not very willing of the time. Seroquel is now not helping her symptoms adequately. Change to Risperdal Refer to psychiatry, names and numbers have been given   # History of seizure disorder, vision disturbance Patient with unusual visual disturbance which could represent absance seizures Her neurologic exam is reassuring and normal Recommend a neurology referral which was placed today.     Orders Placed This Encounter  Procedures  . Lipid panel  . CMP14+EGFR  . CBC with Differential/Platelet  . Ambulatory referral to Neurology    Referral Priority:   Routine    Referral Type:   Consultation    Referral Reason:   Specialty Services Required    Requested Specialty:   Neurology    Number of Visits Requested:   1  . Ambulatory referral to Psychiatry    Referral Priority:   Routine    Referral Type:   Psychiatric    Referral Reason:   Specialty Services Required    Requested Specialty:   Psychiatry    Number of Visits Requested:   1    Meds ordered this encounter  Medications  . risperiDONE (RISPERDAL) 0.5 MG tablet    Sig: Take 1 tablet (0.5 mg total) by mouth at bedtime.    Dispense:  30 tablet    Refill:  [place], MD [facility] 06/16/2017, 5:27 PM

## 2017-06-16 NOTE — Patient Instructions (Signed)
Great to see you!  Stop seroquel, start risperdal, 1 pill once daily at night.   Call a psychiatrist listed below  You will get a call for neurology  Your provider wants you to schedule an appointment with a Psychologist/Psychiatrist. The following list of offices requires the patient to call and make their own appointment, as there is information they need that only you can provide. Please feel free to choose form the following providers:  [facility]   [phone] Crisis Recovery in [place] [phone]  [facility]  [phone] [place], [place]  (Scheduled through [facility]) Must call and do an interview for appointment. Sees Children / Accepts Medicaid  [facility]    [phone]  [address]    [place], [place]       [facility]  [phone] [address] [place], [place]  Evaluates for Autism but does not treat it Sees Children / Accepts Medicaid  [facility]    [phone] [address]   [place], [place] Medication management, substance abuse, bipolar, grief, family, marriage, OCD, anxiety, PTSD Sees children / Accepts Medicaid  [facility]    [phone] [address] [place], [place] Sees children / Accepts [facility]  [facility]  [phone] [address] [place], [place]   Dr Estelle Grumbles     [phone] [address] [place], [place]  Sees ADD & ADHD for treatment Accepts Medicaid  [facility]  [phone] [place], [place] Evaluates for Autism Accepts [facility]  [facility]  [phone] [address] [place], [place]  Does Adult ADD evaluations Does not accept Medicaid  [facility]   [phone] [address] [place], [place] Uses animal therapy  Sees children as young as 67 years old Accepts  [facility]     [phone]    [address]  [place], [place] [address] Sees children Accepts Medicaid

## 2017-06-22 ENCOUNTER — Telehealth: Payer: Self-pay | Admitting: Family Medicine

## 2017-06-22 MED ORDER — TRAZODONE HCL 100 MG PO TABS: 50.0000 mg | ORAL_TABLET | Freq: Every day | ORAL | 3 refills | Status: DC

## 2017-06-22 NOTE — Telephone Encounter (Signed)
Pt aware.

## 2017-06-22 NOTE — Telephone Encounter (Signed)
Trial of trazodone  Haley Sink, MD [place] 06/22/2017, 9:49 AM

## 2017-07-01 ENCOUNTER — Telehealth: Payer: Self-pay | Admitting: Family Medicine

## 2017-07-01 MED ORDER — AMOXICILLIN 500 MG PO CAPS: 500.0000 mg | ORAL_CAPSULE | Freq: Three times a day (TID) | ORAL | 0 refills | Status: DC

## 2017-07-01 NOTE — Telephone Encounter (Signed)
It would be best to be seen but given the circumstances will send antibiotics.   Haley SinkSam Bradshaw, MD [facility] 07/01/2017, 4:40 PM

## 2017-07-01 NOTE — Telephone Encounter (Signed)
What symptoms do you have? Wisdom tooth infection it is errupted, and back of jawline is swollen can not really eat, has fever  How long have you been sick? Three days  Have you been seen for this problem? No but has made appt with dentist he did not have anything until 4 weeks wants antibiotic  If your provider decides to give you a prescription, which pharmacy would you like for it to be sent to? [facility]   Patient informed that this information will be sent to the clinical staff for review and that they should receive a follow up call.

## 2017-07-01 NOTE — Telephone Encounter (Signed)
Patient aware.

## 2017-07-01 NOTE — Telephone Encounter (Signed)
Please advise 

## 2017-07-16 ENCOUNTER — Encounter: Payer: Self-pay | Admitting: Family Medicine

## 2017-07-16 ENCOUNTER — Ambulatory Visit (INDEPENDENT_AMBULATORY_CARE_PROVIDER_SITE_OTHER): Payer: Managed Care, Other (non HMO) | Admitting: Family Medicine

## 2017-07-16 VITALS — BP 112/77 | HR 74 | Temp 98.4°F | Ht 62.0 in | Wt 164.8 lb

## 2017-07-16 DIAGNOSIS — F411 Generalized anxiety disorder: Secondary | ICD-10-CM

## 2017-07-16 MED ORDER — QUETIAPINE FUMARATE 50 MG PO TABS
50.0000 mg | ORAL_TABLET | Freq: Every day | ORAL | 3 refills | Status: DC
Start: 2017-07-16 — End: 2018-01-25

## 2017-07-16 NOTE — Progress Notes (Signed)
   HPI  Patient presents today for follow-up for anxiety.  Last visit patient was complaining of uncontrolled anxiety. She's been taking Seroquel fo . She switched to Risperdal which she states helped her energy level, however did not allow her to sleep. Trazodone caused anxiousness and states that she went "completely manic".  She denies suicidal thoughts.  She has follow-up scheduled with neurology and psychiatry. She has not return for fasting labs which was discussed today.  PMH: Smoking status noted ROS: Per HPI  Objective: BP 112/77   Pulse 74   Temp 98.4 F (36.9 C) (Oral)   Ht 5\' 2"  (1.575 m)   Wt 164 lb 12.8 oz (74.8 kg)   BMI 30.14 kg/m  Gen: NAD, alert, cooperative with exam HEENT: NCAT CV: RRR, good S1/S2, no murmur Resp: CTABL, no wheezes, non-labored Ext: No edema, warm Neuro: Alert and oriented, No gross deficits    Depression screen [facility] 2/9 07/16/2017 06/16/2017 12/29/2016 12/26/2016 07/04/2016  Decreased Interest 0 Down, Depressed, Hopeless 0 2 0 1 1  PHQ - 2 Score 0 Altered sleeping - Tired, decreased energy - Change in appetite - Feeling bad or failure about yourself  - 1 0 0 1  Trouble concentrating - Moving slowly or fidgety/restless - 0 0 0 3  Suicidal thoughts - 0 0 0 0  PHQ-9 Score - Difficult doing work/chores - Somewhat difficult - - Somewhat difficult     Assessment and plan:  # Generalized anxiety disorder With some concern with for underlying bipolar disorder Discontinue Resporal and trazodone Restart Seroquel Encouraged follow-up with behavioral health, she has an appointment set. Also discussed coming back for fasting labs, we had explaining that these medications can have metabolic effects.    Meds ordered this encounter  Medications  . levonorgestrel (MIRENA) 20  MCG/24HR IUD    Sig: 1 each by Intrauterine route once.  Marland Kitchen DISCONTD: QUEtiapine (SEROQUEL) 50 MG tablet    Sig: Take 50 mg by mouth at bedtime.  Marland Kitchen QUEtiapine (SEROQUEL) 50 MG tablet    Sig: Take 1 tablet (50 mg total) by mouth at bedtime.    Dispense:  30 tablet    Refill:  3    Murtis Sink, MD [facility] 07/16/2017, 3:14 PM

## 2017-08-03 ENCOUNTER — Ambulatory Visit: Payer: Managed Care, Other (non HMO) | Admitting: Diagnostic Neuroimaging

## 2017-08-04 ENCOUNTER — Encounter: Payer: Self-pay | Admitting: Diagnostic Neuroimaging

## 2017-08-26 ENCOUNTER — Ambulatory Visit (INDEPENDENT_AMBULATORY_CARE_PROVIDER_SITE_OTHER): Payer: Managed Care, Other (non HMO) | Admitting: Psychiatry

## 2017-08-26 VITALS — BP 122/70 | HR 78 | Ht 62.0 in | Wt 170.0 lb

## 2017-08-26 DIAGNOSIS — F4312 Post-traumatic stress disorder, chronic: Secondary | ICD-10-CM | POA: Diagnosis not present

## 2017-08-26 DIAGNOSIS — F603 Borderline personality disorder: Secondary | ICD-10-CM | POA: Diagnosis not present

## 2017-08-26 MED ORDER — FLUVOXAMINE MALEATE 100 MG PO TABS: 50.0000 mg | ORAL_TABLET | Freq: Every day | ORAL | 1 refills | Status: DC

## 2017-08-26 MED ORDER — PRAZOSIN HCL 2 MG PO CAPS: 2.0000 mg | ORAL_CAPSULE | Freq: Every day | ORAL | 1 refills | Status: DC

## 2017-08-26 NOTE — Patient Instructions (Signed)
START Luvox 50 mg (1/2 tablet) at night for 1 week, then increase to 1 whole tablet nightly  Okay to continue seroquel to 1/2 tablet of seroquel at night  START Prazosin 2 mg at night   Lets get you set up with an individual therapist to learn DBT skills
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
## 2017-09-29 ENCOUNTER — Encounter (HOSPITAL_COMMUNITY): Payer: Self-pay | Admitting: Psychology

## 2017-09-29 ENCOUNTER — Ambulatory Visit (HOSPITAL_COMMUNITY): Payer: 59 | Admitting: Psychology

## 2017-09-29 DIAGNOSIS — F4312 Post-traumatic stress disorder, chronic: Secondary | ICD-10-CM
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
## 2017-10-22 ENCOUNTER — Ambulatory Visit (HOSPITAL_COMMUNITY): Payer: 59 | Admitting: Psychology

## 2017-10-22 DIAGNOSIS — F4312 Post-traumatic stress disorder, chronic: Secondary | ICD-10-CM | POA: Diagnosis not present

## 2017-10-22 NOTE — Progress Notes (Signed)
   THERAPIST PROGRESS NOTE  Session Time: 9.05am-9.55am  Participation Level: Active  Behavioral Response: Well GroomedAlertaffect wnl  Type of Therapy: Individual Therapy  Treatment Goals addressed: Diagnosis: PTSD and goal 1.  Interventions: CBT, Supportive and Other: tx planning  Summary: Haley Clements is a 36 y.o. female who presents with affect wnl.  Pt reported that she has still felt tired daily, could sleep majority of day if didn't have to get up for responsibilies, feels fatigued throughout the day, still feels anxious as well.  Pt reported that some anxiety may be related to changes.  She was offered position w/ former employer [place]- so job change again and busier location.  Pt reported holidays were ok.  Pt discussed that was bothered by hearing from mom that friend had falling out with is now hanging w/ brother. Pt acknowledges that this is pattern of feeling less than brother- although she recognizes this as distortion- brother drug use, not doing well.  Pt also recognized that this relationship w/ friend was not likely healthy for her- she gave everything in relationship- friend had a lot of "drama" going on and would impact her and friend not able to reciprocate.  Pt discussed how this even plays out in her relationship w/ husband- typically following what she feels will please him- not identifying her own thoughts of what is good for self- but starting to do in some ways now.   Suicidal/Homicidal: Nowithout intent/plan  Therapist Response: Assessed pt current functioning per pt report.  Processed w/ pt her wants for counseling and discussed/developed tx plan.  Explored w/pt relationships and patterns in her relationships- how value impacted by how others respond and reciprocate.   Plan: Return again in 2 weeks.  Diagnosis: PTSD   YATES,LEANNE, LPC 10/22/2017

## 2017-11-09 ENCOUNTER — Ambulatory Visit (HOSPITAL_COMMUNITY): Payer: 59 | Admitting: Psychology

## 2017-11-11 ENCOUNTER — Ambulatory Visit (INDEPENDENT_AMBULATORY_CARE_PROVIDER_SITE_OTHER): Payer: 59 | Admitting: Psychology

## 2017-11-11 DIAGNOSIS — F4312 Post-traumatic stress disorder, chronic: Secondary | ICD-10-CM

## 2017-11-11 NOTE — Progress Notes (Signed)
   THERAPIST PROGRESS NOTE  Session Time: 9.08am-10.15am  Participation Level: Active  Behavioral Response: Well GroomedAlertDepressed  Type of Therapy: Individual Therapy  Treatment Goals addressed: Diagnosis: PtSD and goal 1.\  Interventions: CBT  Summary: Haley Clements is a 37 y.o. female who presents with affect wnl.  Pt 36 y/o daughter present today as no childcare.  Pt reported that she made through holidays ok- spent time w/ her mom- in laws.  Pt reports she it struggling w/ finding her self worth when she attempts changes and feels that causing more discord.  Pt reported that she had connected on facebook w/ her exstepfather back a year ago prior to his death in the fall and then connected w/ his parents as well.  Pt reported that when her mom was married to him- she was really being cared for by his parents and they were grandparents to her and took good care of her- but when they separated lost all contact.  Pt reported that this exstepgrandfater just recently died as well but prior to his death she expressed want to visit and wasn't well received by husband.  Pt reports husband can't understand why she would seek contact.  Pt discussed how she then feels guilt or to keep peace doesn't follow through.  Pt discussed how she is looking for times she was valued in her life and feels that had to change and do for others so much not sure she can identify.  Pt is able to ackowledge distortion but has difficulty reframing.    Suicidal/Homicidal: Nowithout intent/plan  Therapist Response: Assessed pt current functioning per pt report. Processed w/pt coping w/ beginning to assert her needs, feelings, wants and communicating.  Explored w/pt her distortions to self worth and ways of challenging.  Discussed process of growth/change and communicating needs as well to maintain  Current relationships- include in growth.   Plan: Return again in 2 weeks.  Diagnosis: PTSD  YATES,LEANNE,  [facility] 11/11/2017

## 2017-11-13 ENCOUNTER — Ambulatory Visit (INDEPENDENT_AMBULATORY_CARE_PROVIDER_SITE_OTHER): Payer: 59 | Admitting: Psychiatry

## 2017-11-13 ENCOUNTER — Encounter (HOSPITAL_COMMUNITY): Payer: Self-pay | Admitting: Psychiatry

## 2017-11-13 DIAGNOSIS — F5101 Primary insomnia: Secondary | ICD-10-CM | POA: Diagnosis not present

## 2017-11-13 DIAGNOSIS — F603 Borderline personality disorder: Secondary | ICD-10-CM

## 2017-11-13 DIAGNOSIS — Z87891 Personal history of nicotine dependence: Secondary | ICD-10-CM

## 2017-11-13 DIAGNOSIS — F4312 Post-traumatic stress disorder, chronic: Secondary | ICD-10-CM

## 2017-11-13 DIAGNOSIS — Z630 Problems in relationship with spouse or partner: Secondary | ICD-10-CM | POA: Diagnosis not present

## 2017-11-13 DIAGNOSIS — Z811 Family history of alcohol abuse and dependence: Secondary | ICD-10-CM | POA: Diagnosis not present

## 2017-11-13 DIAGNOSIS — Z79899 Other long term (current) drug therapy: Secondary | ICD-10-CM | POA: Diagnosis not present

## 2017-11-13 DIAGNOSIS — Z818 Family history of other mental and behavioral disorders: Secondary | ICD-10-CM

## 2017-11-13 MED ORDER — FLUVOXAMINE MALEATE 100 MG PO TABS: 200.0000 mg | ORAL_TABLET | Freq: Every day | ORAL | 1 refills | Status: DC

## 2017-11-13 MED ORDER — PRAZOSIN HCL 5 MG PO CAPS: 5.0000 mg | ORAL_CAPSULE | Freq: Every day | ORAL | 1 refills | Status: DC

## 2017-11-13 NOTE — Progress Notes (Signed)
BH MD/PA/NP OP Progress Note  11/13/2017 10:56 AM Haley Clements  MRN:  [id number]  Chief Complaint: a little better  HPI: Haley Clements reports that she is doing a little bit better in terms of her anger, irritability.  She reports that she is trying to work on her self-esteem, and we spent time exploring her ideas about self-esteem and what this would look like for her.  She denies any suicidal thoughts or unsafe thoughts.  She reports that there is been continued conflict with her husband, issues of mistrust from him, and he becomes less trusting of her as she tries to change and improve herself, convinced that she is trying to leave him or have an affair with other people.  She reports that this is not the case, I encouraged her to bring him for one of her therapy visits and/or medication management visits so that we could provide education.  She reports that prazosin helps a little bit with her sleep, and Luvox has been helpful for her mood.  Denies any hypotension or dizziness.  We agreed to increase prazosin to 4 mg nightly after 1-2 weeks then increase to 5 mg.  We also agreed to increase Luvox to 150 mg nightly, then 200 mg after 1-2 weeks as tolerated.  She continues to use Seroquel off and on as needed for sleep.  We will follow-up in 10-12 weeks.  Visit Diagnosis:    ICD-10-CM   1. Chronic post-traumatic stress disorder (PTSD) F43.12 prazosin (MINIPRESS) 5 MG capsule    fluvoxaMINE (LUVOX) 100 MG tablet  2. Primary insomnia F51.01 prazosin (MINIPRESS) 5 MG capsule    fluvoxaMINE (LUVOX) 100 MG tablet    Past Psychiatric History: See intake H&P for full details. Reviewed, with no updates at this time.   Past Medical History:  Past Medical History:  Diagnosis Date  . Anxiety   . Depression    no meds with preg, doing ok  . Eczema   . Gallstone   . Headache(784.0)   . Seizures (HCC)    during puberty    Past Surgical History:  Procedure Laterality Date  . NO PAST SURGERIES       Family Psychiatric History: See intake H&P for full details. Reviewed, with no updates at this time.   Family History:  Family History  Problem Relation Age of Onset  . Diabetes Father   . Hypertension Father   . Cancer Father   . Alcohol abuse Father   . Diabetes Maternal Grandmother   . Hypertension Maternal Grandmother   . COPD Maternal Grandmother   . Heart disease Paternal Grandfather   . Depression Mother   . Anxiety disorder Mother   . Hearing loss Neg Hx     Social History:  Social History   Socioeconomic History  . Marital status: Married    Spouse name: Not on file  . Number of children: Not on file  . Years of education: Not on file  . Highest education level: Not on file  Social Needs  . Financial resource strain: Not on file  . Food insecurity - worry: Not on file  . Food insecurity - inability: Not on file  . Transportation needs - medical: Not on file  . Transportation needs - non-medical: Not on file  Occupational History  . Not on file  Tobacco Use  . Smoking status: Former Games developermoker  . Smokeless tobacco: Never Used  . Tobacco comment: quit 2005  Substance and Sexual Activity  .  Alcohol use: No    Comment: occ, not with preg  . Drug use: No  . Sexual activity: Yes    Birth control/protection: None  Other Topics Concern  . Not on file  Social History Narrative  . Not on file    Allergies:  Allergies  Allergen Reactions  . Strawberry Extract Anaphylaxis  . Naproxen Nausea And Vomiting    Metabolic Disorder Labs: No results found for: HGBA1C, MPG No results found for: PROLACTIN Lab Results  Component Value Date   CHOL 204 (H) 07/04/2016   TRIG 115 07/04/2016   HDL 51 07/04/2016   CHOLHDL 4.0 07/04/2016   LDLCALC 130 (H) 07/04/2016   LDLCALC 133 (H) 01/11/2016   Lab Results  Component Value Date   TSH 0.596 07/04/2016   TSH 1.430 01/11/2016    Therapeutic Level Labs: No results found for: LITHIUM No results found for:  VALPROATE No components found for:  CBMZ  Current Medications: Current Outpatient Medications  Medication Sig Dispense Refill  . fluvoxaMINE (LUVOX) 100 MG tablet Take 2 tablets (200 mg total) by mouth at bedtime. Take 150 mg nightly x 2 weeks, then Increase to 200 mg nightly 180 tablet 1  . levonorgestrel (MIRENA) 20 MCG/24HR IUD 1 each by Intrauterine route once.    . prazosin (MINIPRESS) 5 MG capsule Take 1 capsule (5 mg total) by mouth at bedtime. 90 capsule 1  . QUEtiapine (SEROQUEL) 50 MG tablet Take 1 tablet (50 mg total) by mouth at bedtime. 30 tablet 3   No current facility-administered medications for this visit.      Musculoskeletal: Strength & Muscle Tone: within normal limits Gait & Station: normal Patient leans: N/A  Psychiatric Specialty Exam: ROS  There were no vitals taken for this visit.There is no height or weight on file to calculate BMI.  General Appearance: Casual and Fairly Groomed  Eye Contact:  Fair  Speech:  Clear and Coherent and Normal Rate  Volume:  Normal  Mood:  Euthymic  Affect:  Congruent and Tearful  Thought Process:  Goal Directed and Descriptions of Associations: Intact  Orientation:  Full (Time, Place, and Person)  Thought Content: Logical   Suicidal Thoughts:  No  Homicidal Thoughts:  No  Memory:  Immediate;   Fair  Judgement:  Fair  Insight:  Shallow  Psychomotor Activity:  Normal  Concentration:  Concentration: Good  Recall:  Good  Fund of Knowledge: Good  Language: Good  Akathisia:  Negative  Handed:  Right  AIMS (if indicated): not done  Assets:  Communication Skills Desire for Improvement Financial Resources/Insurance Housing Intimacy Transportation Vocational/Educational  ADL's:  Intact  Cognition: WNL  Sleep:  Fair   Screenings: PHQ2-9     Office Visit from 07/16/2017 in [facility] Office Visit from 06/16/2017 in [facility]2016 [facility] Visit from 12/26/2016 in [facility] Office Visit from 07/04/2016 in [facility]  PHQ-2 Total Score  0  4  2  3  3   PHQ-9 Total Score  No data  16  11  12  15        Assessment and Plan:  Haley Clements presents today for medication management follow-up for PTSD and borderline personality disorder.  She has had a significant improvement in her mood lability and irritability with Luvox, and would benefit from an up titration as below.  She has had some improvements in sleep, but continues  to suffer with nightmares so she has been using prazosin with Seroquel.  I encouraged her to use Seroquel on an as-needed basis only and we would increase the dose of prazosin to 5 mg.  I educated her on the risk of postural hypotension and encouraged her to stay hydrated.  I spent time with the patient exploring concepts of self-esteem, educating her on the trajectory of therapy and how this can often cause changes in marital dynamics.  I encouraged her to bring her husband for additional psychoeducation at her next therapist or medication management visit.  No acute safety issues, we will follow-up in 10-12 weeks.  1. Chronic post-traumatic stress disorder (PTSD)   2. Primary insomnia     Status of current problems: gradually improving  Labs Ordered: No orders of the defined types were placed in this encounter.   Labs Reviewed: na  Collateral Obtained/Records Reviewed: na  Plan:  Increase Luvox to 150 mg nightly times 2 weeks, then 200 mg nightly Increase prazosin to 4 mg nightly for 1 week, then 5 mg nightly thereafter Okay to continue Seroquel 50 mg nightly on an as-needed basis for sleep Return to clinic in 10-12 weeks Continue individual therapy every 1-2 weeks  I spent 30 minutes with the patient in direct face-to-face clinical care.  Greater than 50% of this time was spent in counseling and coordination of care with the  patient.    Burnard Leigh, MD 11/13/2017, 10:56 AM

## 2017-11-25 ENCOUNTER — Ambulatory Visit (HOSPITAL_COMMUNITY): Payer: Self-pay | Admitting: Psychology

## 2017-12-08 ENCOUNTER — Ambulatory Visit (INDEPENDENT_AMBULATORY_CARE_PROVIDER_SITE_OTHER): Payer: 59 | Admitting: Psychology

## 2017-12-08 DIAGNOSIS — F4312 Post-traumatic stress disorder, chronic: Secondary | ICD-10-CM | POA: Diagnosis not present
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
## 2017-12-24 ENCOUNTER — Ambulatory Visit (HOSPITAL_COMMUNITY): Payer: Self-pay | Admitting: Psychology

## 2018-01-04 ENCOUNTER — Ambulatory Visit (INDEPENDENT_AMBULATORY_CARE_PROVIDER_SITE_OTHER): Payer: 59 | Admitting: Psychology

## 2018-01-04 DIAGNOSIS — F4312 Post-traumatic stress disorder, chronic: Secondary | ICD-10-CM | POA: Diagnosis not present

## 2018-01-04 NOTE — Progress Notes (Signed)
   THERAPIST PROGRESS NOTE  Session Time: 11.02am-12pm  Participation Level: Active  Behavioral Response: Well GroomedAlertDepressed  Type of Therapy: Individual Therapy  Treatment Goals addressed: Diagnosis: PTSD and goal 1.  Interventions: CBT and Supportive  Summary: Haley Clements is a 37 y.o. female who presents with affect congruent w/ report of depressed and some apathy. Pt reported that husband and her relationship still major stressor.  Pt reports that at the point of not caring and other times not able to imagine life w/out. Pt reports she is tired from working extra, caretaking for kids and not feeling that getting support.  Pt reported she has taking herself out of the sports w/ kids as way of setting boundary on not taking something else on. Pt reported husband accused of not being good mom because.  Pt identified this is not the case and didn't get into conflict re:Marland Kitchen.  Pt reports not hopeful that they will get away this weekend as he planned- pt reports hasn't heard from him re: and doesn't feel like asking about and acknowledges this is consistent w/ lack fo care attitude.   Suicidal/Homicidal: Nowithout intent/plan  Therapist Response: Assessed pt current functioning per pt report.  Processed w/pt stressor of relationship and her reaction towards.  Discussed how to engage on and options for continuing to communicate vs. Shutting down.  Discussed her wants and how husbands perspective may differ. Plan: Return again in 2 weeks.  Diagnosis:  PTSD  YATES,LEANNE, [facility] 01/04/2018

## 2018-01-25 ENCOUNTER — Encounter (HOSPITAL_COMMUNITY): Payer: Self-pay | Admitting: Psychiatry

## 2018-01-25 ENCOUNTER — Ambulatory Visit (INDEPENDENT_AMBULATORY_CARE_PROVIDER_SITE_OTHER): Payer: 59 | Admitting: Psychiatry

## 2018-01-25 VITALS — BP 107/66 | HR 76 | Ht 62.0 in | Wt 163.0 lb

## 2018-01-25 DIAGNOSIS — Z818 Family history of other mental and behavioral disorders: Secondary | ICD-10-CM

## 2018-01-25 DIAGNOSIS — Z87891 Personal history of nicotine dependence: Secondary | ICD-10-CM | POA: Diagnosis not present

## 2018-01-25 DIAGNOSIS — F603 Borderline personality disorder: Secondary | ICD-10-CM | POA: Diagnosis not present

## 2018-01-25 DIAGNOSIS — Z811 Family history of alcohol abuse and dependence: Secondary | ICD-10-CM | POA: Diagnosis not present

## 2018-01-25 DIAGNOSIS — F4312 Post-traumatic stress disorder, chronic: Secondary | ICD-10-CM | POA: Diagnosis not present

## 2018-01-25 MED ORDER — BREXPIPRAZOLE 1 MG PO TABS: 1.0000 mg | ORAL_TABLET | Freq: Every day | ORAL | 0 refills | Status: DC
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
## 2018-01-26 ENCOUNTER — Ambulatory Visit (INDEPENDENT_AMBULATORY_CARE_PROVIDER_SITE_OTHER): Payer: 59 | Admitting: Psychology

## 2018-01-26 DIAGNOSIS — F4312 Post-traumatic stress disorder, chronic: Secondary | ICD-10-CM | POA: Diagnosis not present
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
## 2018-03-02 ENCOUNTER — Telehealth (HOSPITAL_COMMUNITY): Payer: Self-pay

## 2018-03-02 ENCOUNTER — Ambulatory Visit (INDEPENDENT_AMBULATORY_CARE_PROVIDER_SITE_OTHER): Payer: 59 | Admitting: Psychology

## 2018-03-02 ENCOUNTER — Ambulatory Visit: Payer: Managed Care, Other (non HMO) | Admitting: Family

## 2018-03-02 DIAGNOSIS — F4312 Post-traumatic stress disorder, chronic: Secondary | ICD-10-CM

## 2018-03-02 DIAGNOSIS — F4310 Post-traumatic stress disorder, unspecified: Secondary | ICD-10-CM

## 2018-03-02 NOTE — Progress Notes (Signed)
   THERAPIST PROGRESS NOTE  Session Time: 2.25pm-3.08pm  Participation Level: Active  Behavioral Response: Well GroomedAlertAnxious  Type of Therapy: Individual Therapy  Treatment Goals addressed: Diagnosis: PTSD and goal 1.  Interventions: CBT, Assertiveness Training and Supportive  Summary: Haley Clements is a 37 y.o. female who presents with affect congruent w/ report of anxiety.  Pt had mom join for support. Pt reports since change in medication she has been having increasing anxiety- w/ recent panic attacks- 2 weeks ago- lasted crying for 2 hours. Pt reported that aware of trigger for that one- jealousy seeing interaction between husband and another woman at the ball field and expecting worst. Pt reported after several incidents of increased interaction- confronted female about her intentions- she denied any intentions towards her husband and wasn't aware how being perceived. Pt was able to talk w/her husband w/out exploding and express how she was feeling- her needs for more emotional support and concerns she had.  Pt reported that he was guarded initially but did express feelings for her.  Pt aware that she is struggling w/ trust, but that husbands lack of communication and quick to blame her doesn't help.  Mother also reports seeing increased anxiety even before worry about infidelity.  Pt awareness of need to continue express herself w/ I messages and not coming w/ blame of him.  Pt also increased awareness of accepting feeling of anxiety doesn't equal something bad is happening.   Suicidal/Homicidal: Nowithout intent/plan  Therapist Response: Assessed pt current functioning per pt report. Processed w/pt increased anxiety- discussed triggers.  Explored w/pt ways of communicating her emotions so doesn't end up in explosive.  Discussed use of I messages and tone for open communication.  Discussed pt seeking support of mom which has been beneficial recent. Connected pt w/ CMA to discuss  medication concerns since change in medication.   Plan: Return again in 2 weeks.  Diagnosis: PTSD   Neftali Abair, LPC 03/02/2018

## 2018-03-02 NOTE — Telephone Encounter (Signed)
Patient was here to see Haley Clements and came by my office to let me know that she is having panic attacks that are bringing her to tears, she is having nightmares that will wake her up out of her sleep in a panic attack. I moved her appointment up, is there anything else you can suggest? Please review and advise, thank you

## 2018-03-05 ENCOUNTER — Encounter: Payer: Self-pay | Admitting: Family Medicine

## 2018-03-12 ENCOUNTER — Encounter (HOSPITAL_COMMUNITY): Payer: Self-pay | Admitting: Psychiatry

## 2018-03-12 ENCOUNTER — Ambulatory Visit (INDEPENDENT_AMBULATORY_CARE_PROVIDER_SITE_OTHER): Payer: 59 | Admitting: Psychiatry

## 2018-03-12 DIAGNOSIS — F4312 Post-traumatic stress disorder, chronic: Secondary | ICD-10-CM

## 2018-03-12 DIAGNOSIS — Z630 Problems in relationship with spouse or partner: Secondary | ICD-10-CM

## 2018-03-12 DIAGNOSIS — Z818 Family history of other mental and behavioral disorders: Secondary | ICD-10-CM | POA: Diagnosis not present

## 2018-03-12 DIAGNOSIS — Z811 Family history of alcohol abuse and dependence: Secondary | ICD-10-CM

## 2018-03-12 DIAGNOSIS — F603 Borderline personality disorder: Secondary | ICD-10-CM | POA: Diagnosis not present

## 2018-03-12 DIAGNOSIS — Z87891 Personal history of nicotine dependence: Secondary | ICD-10-CM | POA: Diagnosis not present

## 2018-03-12 DIAGNOSIS — F419 Anxiety disorder, unspecified: Secondary | ICD-10-CM | POA: Diagnosis not present

## 2018-03-12 MED ORDER — FLUVOXAMINE MALEATE 100 MG PO TABS: 200.0000 mg | ORAL_TABLET | Freq: Every day | ORAL | 0 refills | Status: DC

## 2018-03-12 NOTE — Progress Notes (Signed)
BH MD/PA/NP OP Progress Note  03/12/2018 10:41 AM Haley Clements  MRN:  [id number]  Chief Complaint: Not doing good, Rexulti makes me anxious HPI: Haley Clements continues to present with chronic external locus of control, continues to focus on her marital issues as the primary contributor of her anxiety and distress.  Feels like she had a negative response to Rexulti and it has made her more anxious.  I suggested couples therapy, and she shares that her husband is only willing to do couples therapy with the pastor.  I spent time exploring this with the patient, and discussing my recommendation that she consider his perspective and willingness to do couples therapy in a way that is more comfortable for him, so that they can compromise together on this.  We agreed to continue Luvox, and I spent time with the patient reiterating the importance of individual therapy and coping strategies being the mainstay of her treatment moving forward.  Visit Diagnosis:    ICD-10-CM   1. Borderline personality disorder (HCC) F60.3   2. Chronic post-traumatic stress disorder (PTSD) F43.12 fluvoxaMINE (LUVOX) 100 MG tablet    Past Psychiatric History: See intake H&P for full details. Reviewed, with no updates at this time.   Past Medical History:  Past Medical History:  Diagnosis Date  . Anxiety   . Depression    no meds with preg, doing ok  . Eczema   . Gallstone   . Headache(784.0)   . Seizures (HCC)    during puberty    Past Surgical History:  Procedure Laterality Date  . NO PAST SURGERIES      Family Psychiatric History: See intake H&P for full details. Reviewed, with no updates at this time.   Family History:  Family History  Problem Relation Age of Onset  . Diabetes Father   . Hypertension Father   . Cancer Father   . Alcohol abuse Father   . Diabetes Maternal Grandmother   . Hypertension Maternal Grandmother   . COPD Maternal Grandmother   . Heart disease Paternal Grandfather   .  Depression Mother   . Anxiety disorder Mother   . Hearing loss Neg Hx     Social History:  Social History   Socioeconomic History  . Marital status: Married    Spouse name: Not on file  . Number of children: Not on file  . Years of education: Not on file  . Highest education level: Not on file  Occupational History  . Not on file  Social Needs  . Financial resource strain: Not on file  . Food insecurity:    Worry: Not on file    Inability: Not on file  . Transportation needs:    Medical: Not on file    Non-medical: Not on file  Tobacco Use  . Smoking status: Former Games developer  . Smokeless tobacco: Never Used  . Tobacco comment: quit 2005  Substance and Sexual Activity  . Alcohol use: No    Comment: occ, not with preg  . Drug use: No  . Sexual activity: Yes    Birth control/protection: None  Lifestyle  . Physical activity:    Days per week: Not on file    Minutes per session: Not on file  . Stress: Not on file  Relationships  . Social connections:    Talks on phone: Not on file    Gets together: Not on file    Attends religious service: Not on file    Active member of  club or organization: Not on file    Attends meetings of clubs or organizations: Not on file    Relationship status: Not on file  Other Topics Concern  . Not on file  Social History Narrative  . Not on file    Allergies:  Allergies  Allergen Reactions  . Strawberry Extract Anaphylaxis  . Naproxen Nausea And Vomiting    Metabolic Disorder Labs: No results found for: HGBA1C, MPG No results found for: PROLACTIN Lab Results  Component Value Date   CHOL 204 (H) 07/04/2016   TRIG 115 07/04/2016   HDL 51 07/04/2016   CHOLHDL 4.0 07/04/2016   LDLCALC 130 (H) 07/04/2016   LDLCALC 133 (H) 01/11/2016   Lab Results  Component Value Date   TSH 0.596 07/04/2016   TSH 1.430 01/11/2016    Therapeutic Level Labs: No results found for: LITHIUM No results found for: VALPROATE No components found  for:  CBMZ  Current Medications: Current Outpatient Medications  Medication Sig Dispense Refill  . fluvoxaMINE (LUVOX) 100 MG tablet Take 2 tablets (200 mg total) by mouth at bedtime. 180 tablet 0  . levonorgestrel (MIRENA) 20 MCG/24HR IUD 1 each by Intrauterine route once.     No current facility-administered medications for this visit.      Musculoskeletal: Strength & Muscle Tone: within normal limits Gait & Station: normal Patient leans: N/A  Psychiatric Specialty Exam: ROS  There were no vitals taken for this visit.There is no height or weight on file to calculate BMI.  General Appearance: Casual and Fairly Groomed  Eye Contact:  Fair  Speech:  Clear and Coherent and Normal Rate  Volume:  Normal  Mood:  Not doing good, separation anxiety  Affect:  Appropriate and Congruent  Thought Process:  Goal Directed and Descriptions of Associations: Intact  Orientation:  Full (Time, Place, and Person)  Thought Content: Logical   Suicidal Thoughts:  No  Homicidal Thoughts:  No  Memory:  Immediate;   Good  Judgement:  Fair  Insight:  Shallow  Psychomotor Activity:  Normal  Concentration:  Concentration: Good  Recall:  Good  Fund of Knowledge: Good  Language: Good  Akathisia:  Negative  Handed:  Right  AIMS (if indicated): not done  Assets:  Communication Skills Desire for Improvement Financial Resources/Insurance Housing  ADL's:  Intact  Cognition: WNL  Sleep:  Good   Screenings: PHQ2-9     Office Visit from 07/16/2017 in [facility] Office Visit from 06/16/2017 in [facility] Office Visit from 12/29/2016 in [facility] Office Visit from 12/26/2016 in [facility] Office Visit from 07/04/2016 in [facility]  PHQ-2 Total Score  0  PHQ-9 Total Score  -  Assessment and Plan:  Eilish Mcdaniel continues to present with external locus of  control and anxious distress related to marital stressors.  Her mood has been overall much more stable in terms of depressive symptoms.  Given her concerns about possible anxiety elicited by Rexulti, we agreed to discontinue, she has only been on the medication for approximately 5-6 weeks.  I spent time with the patient reiterating that SSRI combined with individual and couples therapy is the recommended treatment at this time.  I do not feel that any additional medication changes would provide her benefit at this time, we will follow-up in 3 months.  1. Borderline personality disorder (HCC)   2. Chronic post-traumatic stress disorder (PTSD)     Status of current problems: unchanged  Labs Ordered: No orders of the defined types were placed in this encounter.   Labs Reviewed: NA  Collateral Obtained/Records Reviewed: NA  Plan:  Continue Luvox 200 mg daily Patient failed trial with Rexulti, discontinued Return to clinic in 3 months Continue to reiterate importance of individual and couples therapy  I spent 20 minutes with the patient in direct face-to-face clinical care.  Greater than 50% of this time was spent in counseling and coordination of care with the patient.    Burnard Leigh, MD 03/12/2018, 10:41 AM

## 2018-03-16 ENCOUNTER — Ambulatory Visit (INDEPENDENT_AMBULATORY_CARE_PROVIDER_SITE_OTHER): Payer: 59 | Admitting: Psychology

## 2018-03-16 DIAGNOSIS — F4312 Post-traumatic stress disorder, chronic: Secondary | ICD-10-CM

## 2018-03-16 NOTE — Progress Notes (Signed)
   THERAPIST PROGRESS NOTE  Session Time: 3.30pm-4.30pm  Participation Level: Active  Behavioral Response: Well GroomedAlertaffect wnl  Type of Therapy: Individual Therapy  Treatment Goals addressed: Diagnosis: PTSDa nd goal 1.  Interventions: CBT and Supportive  Summary: Miquel Lamson is a 37 y.o. female who presents with affect wnl.  Pt reports that she has been less emotional over past couple of weeks and some positive interactions w/ husband.  Pt reported she took Dr. Unice Bailey advice to be willing to try couple's counseling through pastor and when expressed to husband he stated he never had intention of doing that- more of way to attempt to appease.  Pt reported she has had times of anxiety and reminding self not to follow anxious thoughts and use ways of coping.  Pt just feels that she is trying and wants to see effort from husband as well.  Pt discussed how she sees his passion for baseball and would like to feel some.  Pt agrees to focus on ways he does show love and care for her.    Suicidal/Homicidal: Nowithout intent/plan  Therapist Response: Assessed pt current functioning per pt report.  Processed feelings of insecurity she is having and thoughts related.  Assisted in reframing thoughts and focusing on acknowledging husband way of expressing is different than hers.  Attempt to identify way he shows care and love.   Plan: Return again in 2 weeks.  Diagnosis: PTSD   Meg Niemeier, LPC 03/16/2018

## 2018-03-23 ENCOUNTER — Ambulatory Visit (INDEPENDENT_AMBULATORY_CARE_PROVIDER_SITE_OTHER): Payer: Managed Care, Other (non HMO) | Admitting: Nurse Practitioner

## 2018-03-23 ENCOUNTER — Encounter: Payer: Self-pay | Admitting: Nurse Practitioner

## 2018-03-23 VITALS — BP 96/67 | HR 72 | Temp 98.7°F | Ht 62.0 in | Wt 161.0 lb

## 2018-03-23 DIAGNOSIS — Z30433 Encounter for removal and reinsertion of intrauterine contraceptive device: Secondary | ICD-10-CM

## 2018-03-23 DIAGNOSIS — Z01419 Encounter for gynecological examination (general) (routine) without abnormal findings: Secondary | ICD-10-CM | POA: Diagnosis not present

## 2018-03-23 DIAGNOSIS — Z6829 Body mass index (BMI) 29.0-29.9, adult: Secondary | ICD-10-CM | POA: Diagnosis not present

## 2018-03-23 DIAGNOSIS — E785 Hyperlipidemia, unspecified: Secondary | ICD-10-CM | POA: Insufficient documentation

## 2018-03-23 DIAGNOSIS — Z0000 Encounter for general adult medical examination without abnormal findings: Secondary | ICD-10-CM

## 2018-03-23 MED ORDER — LEVONORGESTREL 20 MCG/24HR IU IUD
INTRAUTERINE_SYSTEM | Freq: Once | INTRAUTERINE | Status: AC
  Administered 2018-03-23: 12:00:00 via INTRAUTERINE

## 2018-03-23 MED ORDER — PHENTERMINE HCL 37.5 MG PO CAPS: 37.5000 mg | ORAL_CAPSULE | ORAL | 2 refills | Status: DC

## 2018-03-23 NOTE — Progress Notes (Addendum)
   Subjective:    Patient ID: Haley Clements, female    DOB: 06/17/1981, 37 y.o.   MRN: [id number]   Chief Complaint: Contraception   HPI Patient comes in today for annual exam and pap. She has had mirena in for >5 years and wants removed and replaced with new device today. She is doing well today without complaints. She has no chronic medical problems and is on no chronic meds. Her only concern is her weight. whe would like to ry adipex to see if will help her loose weight.   Review of Systems  Constitutional: Negative for activity change and appetite change.  HENT: Negative.   Eyes: Negative for pain.  Respiratory: Negative for shortness of breath.   Cardiovascular: Negative for chest pain, palpitations and leg swelling.  Gastrointestinal: Negative for abdominal pain.  Endocrine: Negative for polydipsia.  Genitourinary: Negative.   Skin: Negative for rash.  Neurological: Negative for dizziness, weakness and headaches.  Hematological: Does not bruise/bleed easily.  Psychiatric/Behavioral: Negative.   All other systems reviewed and are negative.      Objective:   Physical Exam  Constitutional: She is oriented to person, place, and time.  HENT:  Head: Normocephalic.  Nose: Nose normal.  Mouth/Throat: Oropharynx is clear and moist.  Eyes: Pupils are equal, round, and reactive to light. EOM are normal.  Neck: Normal range of motion. Neck supple. No JVD present. Carotid bruit is not present.  Cardiovascular: Normal rate, regular rhythm, normal heart sounds and intact distal pulses.  Pulmonary/Chest: Effort normal and breath sounds normal. No respiratory distress. She has no wheezes. She has no rales. She exhibits no tenderness.  Abdominal: Soft. Normal appearance, normal aorta and bowel sounds are normal. She exhibits no distension, no abdominal bruit, no pulsatile midline mass and no mass. There is no splenomegaly or hepatomegaly. There is no tenderness.  Genitourinary: Vagina normal  and uterus normal. No vaginal discharge found.  Genitourinary Comments: No adnexal mass or tenderness  Musculoskeletal: Normal range of motion. She exhibits no edema.  Lymphadenopathy:    She has no cervical adenopathy.  Neurological: She is alert and oriented to person, place, and time. She has normal reflexes.  Skin: Skin is warm and dry.  Psychiatric: She has a normal mood and affect. Her behavior is normal. Judgment and thought content normal.  Nursing note and vitals reviewed.   Procedure:  Lithotomy position  larde speculum inserted  Betadine to cervical os  Single tooth tenaculum to ant lip of cervix  Sound measures 7.5  Dilator used to dilated upper end of cervix  mirena inserted without complications  Tenaculum and speculum removed  Patient tolerated well   BP 96/67   Pulse 72   Temp 98.7 F (37.1 C) (Oral)   Ht '5\' 2"'$  (1.575 m)   Wt 161 lb (73 kg)   BMI 29.45 kg/m        Assessment & Plan:  Haley Clements in today with chief complaint of Contraception   1. Annual physical exam - CBC with Differential/Platelet - CMP14+EGFR - Lipid panel - Thyroid Panel With TSH  2. Gynecologic exam normal - IGP, Aptima HPV, rfx 16/18,45  3. Encounter for IUD removal and reinsertion Patient tolerated procedure well Patient knows how to check for string - levonorgestrel (MIRENA) 20 MCG/24HR IUD  4. BMI 29.5 - adipex 37.5 1 po qd #30 2 refills  folllow up In 1 year  Mary-Margaret Hassell Done, FNP

## 2018-03-23 NOTE — Patient Instructions (Signed)

## 2018-03-23 NOTE — Addendum Note (Signed)
Addended by: Bennie Pierini on: 03/23/2018 11:53 AM   Modules accepted: Orders

## 2018-03-24 ENCOUNTER — Ambulatory Visit (HOSPITAL_COMMUNITY): Payer: Self-pay | Admitting: Psychiatry

## 2018-03-24 LAB — CMP14+EGFR
ALBUMIN: 4.1 g/dL (ref 3.5–5.5)
ALT: 16 IU/L (ref 0–32)
AST: 12 IU/L (ref 0–40)
Albumin/Globulin Ratio: 1.7 (ref 1.2–2.2)
Alkaline Phosphatase: 74 IU/L (ref 39–117)
BUN/Creatinine Ratio: 14 (ref 9–23)
BUN: 10 mg/dL (ref 6–20)
Bilirubin Total: 0.9 mg/dL (ref 0.0–1.2)
CALCIUM: 9 mg/dL (ref 8.7–10.2)
CO2: 22 mmol/L (ref 20–29)
CREATININE: 0.73 mg/dL (ref 0.57–1.00)
Chloride: 105 mmol/L (ref 96–106)
GFR calc Af Amer: 123 mL/min/{1.73_m2} (ref >59)
GFR calc non Af Amer: 107 mL/min/{1.73_m2} (ref >59)
GLOBULIN, TOTAL: 2.4 g/dL (ref 1.5–4.5)
Glucose: 90 mg/dL (ref 65–99)
Potassium: 4.5 mmol/L (ref 3.5–5.2)
SODIUM: 140 mmol/L (ref 134–144)
Total Protein: 6.5 g/dL (ref 6.0–8.5)

## 2018-03-24 LAB — LIPID PANEL
Chol/HDL Ratio: 4.3 ratio (ref 0.0–4.4)
Cholesterol, Total: 200 mg/dL — ABNORMAL HIGH (ref 100–199)
HDL: 47 mg/dL (ref >39)
LDL CALC: 136 mg/dL — AB (ref 0–99)
Triglycerides: 83 mg/dL (ref 0–149)
VLDL Cholesterol Cal: 17 mg/dL (ref 5–40)

## 2018-03-24 LAB — CBC WITH DIFFERENTIAL/PLATELET
Basophils Absolute: 0 10*3/uL (ref 0.0–0.2)
Basos: 0 %
EOS (ABSOLUTE): 0.1 10*3/uL (ref 0.0–0.4)
EOS: 2 %
HEMATOCRIT: 41.6 % (ref 34.0–46.6)
HEMOGLOBIN: 14.8 g/dL (ref 11.1–15.9)
Immature Grans (Abs): 0 10*3/uL (ref 0.0–0.1)
Immature Granulocytes: 0 %
LYMPHS ABS: 1.9 10*3/uL (ref 0.7–3.1)
Lymphs: 27 %
MCH: 30.3 pg (ref 26.6–33.0)
MCHC: 35.6 g/dL (ref 31.5–35.7)
MCV: 85 fL (ref 79–97)
MONOCYTES: 7 %
Monocytes Absolute: 0.5 10*3/uL (ref 0.1–0.9)
Neutrophils Absolute: 4.6 10*3/uL (ref 1.4–7.0)
Neutrophils: 64 %
Platelets: 283 10*3/uL (ref 150–450)
RBC: 4.88 x10E6/uL (ref 3.77–5.28)
RDW: 13.2 % (ref 12.3–15.4)
WBC: 7.2 10*3/uL (ref 3.4–10.8)

## 2018-03-24 LAB — THYROID PANEL WITH TSH
Free Thyroxine Index: 1.7 (ref 1.2–4.9)
T3 UPTAKE RATIO: 25 % (ref 24–39)
T4 TOTAL: 6.8 ug/dL (ref 4.5–12.0)
TSH: 1.3 u[IU]/mL (ref 0.450–4.500)

## 2018-03-26 LAB — IGP, APTIMA HPV, RFX 16/18,45
HPV APTIMA: NEGATIVE
PAP Smear Comment: 0

## 2018-04-05 ENCOUNTER — Ambulatory Visit (HOSPITAL_COMMUNITY): Payer: Self-pay | Admitting: Psychology

## 2018-04-05 ENCOUNTER — Encounter (HOSPITAL_COMMUNITY): Payer: Self-pay | Admitting: Psychology

## 2018-04-05 NOTE — Progress Notes (Signed)
Haley Clements is a 37 y.o. female patient who didn't show for appointment.  Counselor called pt, voicemail full- unable to leave message.  Pt next appointment is scheduled for 05/04/18.        Forde RadonYATES,LEANNE, LPC

## 2018-05-04 ENCOUNTER — Ambulatory Visit (HOSPITAL_COMMUNITY): Payer: Self-pay | Admitting: Psychology

## 2018-05-18 ENCOUNTER — Ambulatory Visit (INDEPENDENT_AMBULATORY_CARE_PROVIDER_SITE_OTHER): Payer: 59 | Admitting: Psychology

## 2018-05-18 DIAGNOSIS — F4312 Post-traumatic stress disorder, chronic: Secondary | ICD-10-CM | POA: Diagnosis not present

## 2018-05-18 NOTE — Progress Notes (Signed)
   THERAPIST PROGRESS NOTE  Session Time: 1.30pm-2.35pm  Participation Level: Active  Behavioral Response: Well GroomedAlertAnxious and Irritable  Type of Therapy: Family Therapy  Treatment Goals addressed: Diagnosis: PTSD and goal 1.  Interventions: CBT, Supportive and Other: communication and referral for marital counseling  Summary: Haley Clements is a 37 y.o. female who presents with affect congruent w/ report of stressed, anxious, irritable.  Pt reported that her husband was present today to come into counseling as "didn't have a choice" after recent conflict in which police called.  Pt reported that ready for stress of this month to be over.  Pt had husband join session to communicate struggles she is having and express need want for couples counseling.  Husband was a little guarded initially.  Pt and husband reported conflict happened after escalated from facebook post by female "facebook" friend.  Pt felt that on social media shouldn't be friends w/ single females and that her comment to his post about liquor shots was crossing boundary- husband didn't see and this way and reported no intention different.  pt expressed her insecurities and distrust w/ past infidelity.  Pt expressed want for time together and to feel important.  Husband expressed also wanting time together and struggles w/ kids at home.  Pt and husband both expressed frustration w/ others behaviors and how handling things. Pt is agreeable to couples counseling and referral.   Suicidal/Homicidal: Nowithout intent/plan  Therapist Response: Assessed pt current functioning per pt and parent report.  Processed w/pt and husband recent conflict and misunderstanding and different perspectives.  Reflected common ground for relationship and both frustrated and that couples counseling could benefit working towards strengthening relationship each aware of areas to change unhelpful patterns.  Plan: Return again in 2 weeks for individual  counseling.  Gave contact information for [facility] for marriage counseling.   Diagnosis: PTSD  Tylor Gambrill, LPC 05/18/2018

## 2018-06-12 ENCOUNTER — Ambulatory Visit (HOSPITAL_COMMUNITY): Payer: Self-pay | Admitting: Psychiatry

## 2018-06-22 ENCOUNTER — Ambulatory Visit (INDEPENDENT_AMBULATORY_CARE_PROVIDER_SITE_OTHER): Payer: 59 | Admitting: Psychology

## 2018-06-22 DIAGNOSIS — F4312 Post-traumatic stress disorder, chronic: Secondary | ICD-10-CM | POA: Diagnosis not present

## 2018-06-22 NOTE — Progress Notes (Signed)
   THERAPIST PROGRESS NOTE  Session Time: 10.05am-10.55am  Participation Level: Active  Behavioral Response: Well GroomedAlertaffect depressed  Type of Therapy: Individual Therapy  Treatment Goals addressed: Diagnosis: PTSD and goal 1.  Interventions: CBT and Supportive  Summary: Haley Clements is a 37 y.o. female who presents with affect depressed- pt teary eyed at times when discussing relationship.  Pt reported that a couple of weeks ago- pt communicated that relationship was over to husband and he responded w/ urgency to change and talked things out.  Pt reported that for 2 weeks saw some efforts and then back to same patterns.  Pt reported that she has now just withdrawn and he hasn't communicated.  Pt discussed that she has become more aware that she has sacrificed and change, more aware of red flags of controlling on his part and not good enough no matter what she does.  pt feels now decision of staying in household and continuing as unit w/out relationship- raising children or separate and deal w/ stressors of custody.  Pt reported husband informed her she could never make it on her own.  Pt feels that she has withdrawn from friends and mom as well over past week as way of distancing from emotion.  Pt reports that not feeling depressed mood that is consistent and really only minimal anxiety and situations- daughter starting kindergarten.  Pt reports she hasn't taken meds for 2 weeks and if can maintain w/out getting depressed or overall anxious plans to not continue meds. .   Suicidal/Homicidal: Nowithout intent/plan  Therapist Response: Assessed pt current functioning per pt report. Processed w/pt coping w/ interactions and martial stressors report.  Assisted pt in identifying her coping skills and her supports and how to reengage her support system.   Plan: Return again in 2 weeks.  Diagnosis: PTSD  YATES,LEANNE, LPC 06/22/2018

## 2018-07-06 ENCOUNTER — Ambulatory Visit (HOSPITAL_COMMUNITY): Payer: Self-pay | Admitting: Psychology

## 2018-07-20 ENCOUNTER — Ambulatory Visit (HOSPITAL_COMMUNITY): Payer: Self-pay | Admitting: Psychology

## 2018-07-20 ENCOUNTER — Encounter (HOSPITAL_COMMUNITY): Payer: Self-pay | Admitting: Psychology

## 2018-07-20 NOTE — Progress Notes (Signed)
Haley Clements is a 37 y.o. female patient who didn't show for appointment.  Letter sent.        Forde RadonYATES,Jaevin Medearis, LPC

## 2018-08-03 ENCOUNTER — Encounter (HOSPITAL_COMMUNITY): Payer: Self-pay | Admitting: Psychology

## 2018-08-03 ENCOUNTER — Ambulatory Visit (HOSPITAL_COMMUNITY): Payer: Self-pay | Admitting: Psychology

## 2018-08-03 NOTE — Progress Notes (Signed)
Haley Clements is a 37 y.o. female patient who didn't show for appointment.  Letter sent.        Zakhi Dupre, LPC 

## 2018-08-17 ENCOUNTER — Encounter (HOSPITAL_COMMUNITY): Payer: Self-pay | Admitting: Psychology

## 2018-08-17 ENCOUNTER — Ambulatory Visit (HOSPITAL_COMMUNITY): Payer: Self-pay | Admitting: Psychology

## 2018-08-17 NOTE — Progress Notes (Signed)
Haley Clements is a 37 y.o. female patient who didn't show for today's appointment.  As this is the 3rd consecutive no show she is being discharged from counseling services.  Outpatient Therapist Discharge Summary  Haley Clements    [birth year]   Admission Date: 09/29/17   Discharge Date:  08/17/18 Reason for Discharge:  3 consecutive no shos Diagnosis:  PTSD  Comments:  Pt had been coming consistently to counseling, then 3 no shows, no response to attempts for contact.  Alfredo Batty, LPC

## 2019-02-03 ENCOUNTER — Encounter: Payer: Self-pay | Admitting: Nurse Practitioner

## 2019-02-03 ENCOUNTER — Other Ambulatory Visit: Payer: Self-pay

## 2019-02-03 ENCOUNTER — Ambulatory Visit (INDEPENDENT_AMBULATORY_CARE_PROVIDER_SITE_OTHER): Payer: Managed Care, Other (non HMO) | Admitting: Nurse Practitioner

## 2019-02-03 DIAGNOSIS — Z6832 Body mass index (BMI) 32.0-32.9, adult: Secondary | ICD-10-CM | POA: Diagnosis not present

## 2019-02-03 DIAGNOSIS — R635 Abnormal weight gain: Secondary | ICD-10-CM

## 2019-02-03 DIAGNOSIS — E669 Obesity, unspecified: Secondary | ICD-10-CM

## 2019-02-03 MED ORDER — PHENTERMINE HCL 37.5 MG PO CAPS: 37.5000 mg | ORAL_CAPSULE | ORAL | 2 refills | Status: DC

## 2019-02-03 NOTE — Progress Notes (Signed)
Patient ID: Haley Clements, female   DOB: 08/14/1981, 38 y.o.   MRN: [id number]     Virtual Visit via telephone Note  I connected with Haley Clements on 02/03/19 at 9:00 AM by telephone and verified that I am speaking with the correct person using two identifiers. Haley Clements is currently located at home and her children is currently with her during visit. The provider, Mary-Margaret Daphine Deutscher, FNP is located in their office at time of visit.  I discussed the limitations, risks, security and privacy concerns of performing an evaluation and management service by telephone and the availability of in person appointments. I also discussed with the patient that there may be a patient responsible charge related to this service. The patient expressed understanding and agreed to proceed.   History and Present Illness:   Chief Complaint: Weight Loss   HPI Patient calls in to discuss weight loss. She was out on phentermine for obesity during her physical exam back in may of 2019. She did not ever take the phentermine., saying she was not ready to watch diet along with it. So now she is ready. Current weight is 176lbs., which is up 15lbs.  * reviewing her chart she has been diagnosed with borderline personality and is suppose to be getting counseling through [facility] behavioral [facility]. She has b=not been showing up for those appointments. She says that she has been doing some stress management that she learned form them and that has really helped.   Review of Systems  Constitutional: Negative for diaphoresis and weight loss.  Eyes: Negative for blurred vision, double vision and pain.  Respiratory: Negative for shortness of breath.   Cardiovascular: Negative for chest pain, palpitations, orthopnea and leg swelling.  Gastrointestinal: Negative for abdominal pain.  Skin: Negative for rash.  Neurological: Negative for dizziness, sensory change, loss of consciousness, weakness and headaches.  Endo/Heme/Allergies:  Negative for polydipsia. Does not bruise/bleed easily.  Psychiatric/Behavioral: Negative for memory loss. The patient does not have insomnia.   All other systems reviewed and are negative.    Observations/Objective: Alert and oriented Ht 5\' 2"  (1.575 m)   Wt 176 lb (79.8 kg)   BMI 32.19 kg/m    Assessment and Plan: Haley Clements in today with chief complaint of Weight Loss   1. BMI 32.0-32.9,adult Low fat or low carb diet Exercise encouraged - phentermine 37.5 MG capsule; Take 1 capsule (37.5 mg total) by mouth every morning.  Dispense: 30 capsule; Refill: 2   Follow Up Instructions: 3 months    I discussed the assessment and treatment plan with the patient. The patient was provided an opportunity to ask questions and all were answered. The patient agreed with the plan and demonstrated an understanding of the instructions.   The patient was advised to call back or seek an in-person evaluation if the symptoms worsen or if the condition fails to improve as anticipated.  The above assessment and management plan was discussed with the patient. The patient verbalized understanding of and has agreed to the management plan. Patient is aware to call the clinic if symptoms persist or worsen. Patient is aware when to return to the clinic for a follow-up visit. Patient educated on when it is appropriate to go to the emergency department.    I provided 8 minutes of non-face-to-face time during this encounter.    Mary-Margaret Daphine Deutscher, FNP

## 2021-04-15 ENCOUNTER — Other Ambulatory Visit: Payer: Self-pay

## 2021-04-15 ENCOUNTER — Ambulatory Visit: Payer: 59 | Admitting: Nurse Practitioner

## 2021-04-15 ENCOUNTER — Encounter: Payer: Self-pay | Admitting: Nurse Practitioner

## 2021-04-15 VITALS — BP 109/74 | HR 61 | Temp 98.0°F | Resp 20 | Ht 62.0 in

## 2021-04-15 DIAGNOSIS — F411 Generalized anxiety disorder: Secondary | ICD-10-CM

## 2021-04-15 MED ORDER — CITALOPRAM HYDROBROMIDE 40 MG PO TABS: 40.0000 mg | ORAL_TABLET | Freq: Every day | ORAL | 3 refills | Status: DC

## 2021-04-15 NOTE — Progress Notes (Signed)
Subjective:    Patient ID: Haley Clements, female    DOB: 11-13-1980, 40 y.o.   MRN: [id number]   Chief Complaint: Anxiety   HPI Patient is here today to discuss her anxiety. She use to see psych several years ago and was diagnosed with PTSD. She has history of Chronic Childhood trauma. She use to be on seroquel and rexalti and that combination was working well. She stopped taking about 3 years ago and was trying to make it without medication. Over the years it has gotten harder and harder to cope. She really just wants to do something for anxiety.  Depression screen [facility] 2/9 04/15/2021 03/23/2018 07/16/2017  Decreased Interest 1 1 0  Down, Depressed, Hopeless 0 1 0  PHQ - 2 Score 1 2 0  Altered sleeping 1 1 -  Tired, decreased energy 1 1 -  Change in appetite 0 1 -  Feeling bad or failure about yourself  0 0 -  Trouble concentrating 1 0 -  Moving slowly or fidgety/restless 0 0 -  Suicidal thoughts 0 0 -  PHQ-9 Score 4 5 -  Difficult doing work/chores Somewhat difficult - -  Some recent data might be hidden   GAD 7 : Generalized Anxiety Score 04/15/2021  Nervous, Anxious, on Edge 3  Control/stop worrying 2  Worry too much - different things 2  Trouble relaxing 3  Restless 1  Easily annoyed or irritable 2  Afraid - awful might happen 0  Total GAD 7 Score 13  Anxiety Difficulty Somewhat difficult       Review of Systems  Constitutional:  Negative for diaphoresis.  Eyes:  Negative for pain.  Respiratory:  Negative for shortness of breath.   Cardiovascular:  Negative for chest pain, palpitations and leg swelling.  Gastrointestinal:  Negative for abdominal pain.  Endocrine: Negative for polydipsia.  Skin:  Negative for rash.  Neurological:  Negative for dizziness, weakness and headaches.  Hematological:  Does not bruise/bleed easily.  All other systems reviewed and are negative.     Objective:   Physical Exam Constitutional:      Appearance: Normal appearance. She is  obese.  Cardiovascular:     Rate and Rhythm: Normal rate and regular rhythm.     Heart sounds: Normal heart sounds.  Pulmonary:     Effort: Pulmonary effort is normal.     Breath sounds: Normal breath sounds.  Skin:    General: Skin is warm.  Neurological:     General: No focal deficit present.     Mental Status: She is alert and oriented to person, place, and time.  Psychiatric:        Mood and Affect: Mood normal.        Behavior: Behavior normal.    BP 109/74   Pulse 61   Temp 98 F (36.7 C) (Temporal)   Resp 20   Ht 5\' 2"  (1.575 m)   SpO2 99%   BMI 32.19 kg/m        Assessment & Plan:   Haley Clements in today with chief complaint of Anxiety   1. GAD (generalized anxiety disorder) Stress management Discussed medication side effects. - citalopram (CELEXA) 40 MG tablet; Take 1 tablet (40 mg total) by mouth daily.  Dispense: 30 tablet; Refill: 3    The above assessment and management plan was discussed with the patient. The patient verbalized understanding of and has agreed to the management plan. Patient is aware to call the clinic if symptoms  persist or worsen. Patient is aware when to return to the clinic for a follow-up visit. Patient educated on when it is appropriate to go to the emergency department.   Mary-Margaret Daphine Deutscher, FNP

## 2021-04-15 NOTE — Patient Instructions (Signed)
Textbook of family medicine (9th ed., pp. 1062-1073). [place], [place]: [employer].">  Stress, Adult Stress is a normal reaction to life events. Stress is what you feel when life demands more than you are used to, or more than you think you can handle. Some stress can be useful, such as studying for a test or meeting a deadline at work. Stress that occurs too often or for too long can cause problems. It can affect your emotional health and interfere with relationships and normal daily activities. Too much stress can weaken your body's defense system (immune system) and increase your risk for physical illness. If you already have a medicalproblem, stress can make it worse. What are the causes? All sorts of life events can cause stress. An event that causes stress for one person may not be stressful for another person. Major life events, whether positive or negative, commonly cause stress. Examples include: Losing a job or starting a new job. Losing a loved one. Moving to a new town or home. Getting married or divorced. Having a baby. Getting injured or sick. Less obvious life events can also cause stress, especially if they occur day after day or in combination with each other. Examples include: Working long hours. Driving in traffic. Caring for children. Being in debt. Being in a difficult relationship. What are the signs or symptoms? Stress can cause emotional symptoms, including: Anxiety. This is feeling worried, afraid, on edge, overwhelmed, or out of control. Anger, including irritation or impatience. Depression. This is feeling sad, down, helpless, or guilty. Trouble focusing, remembering, or making decisions. Stress can cause physical symptoms, including: Aches and pains. These may affect your head, neck, back, stomach, or other areas of your body. Tight muscles or a clenched jaw. Low energy. Trouble sleeping. Stress can cause unhealthy behaviors, including: Eating to feel better  (overeating) or skipping meals. Working too much or putting off tasks. Smoking, drinking alcohol, or using drugs to feel better. How is this diagnosed? Stress is diagnosed through an assessment by your health care provider. He or she may diagnose this condition based on: Your symptoms and any stressful life events. Your medical history. Tests to rule out other causes of your symptoms. Depending on your condition, your health care provider may refer you to aspecialist for further evaluation. How is this treated?  Stress management techniques are the recommended treatment for stress. Medicineis not typically recommended for the treatment of stress. Techniques to reduce your reaction to stressful life events include: Stress identification. Monitor yourself for symptoms of stress and identify what causes stress for you. These skills may help you to avoid or prepare for stressful events. Time management. Set your priorities, keep a calendar of events, and learn to say no. Taking these actions can help you avoid making too many commitments. Techniques for coping with stress include: Rethinking the problem. Try to think realistically about stressful events rather than ignoring them or overreacting. Try to find the positives in a stressful situation rather than focusing on the negatives. Exercise. Physical exercise can release both physical and emotional tension. The key is to find a form of exercise that you enjoy and do it regularly. Relaxation techniques. These relax the body and mind. The key is to find one or more that you enjoy and use the techniques regularly. Examples include: Meditation, deep breathing, or progressive relaxation techniques. Yoga or tai chi. Biofeedback, mindfulness techniques, or journaling. Listening to music, being out in nature, or participating in other hobbies. Practicing a healthy lifestyle.   Eat a balanced diet, drink plenty of water, limit or avoid caffeine, and get  plenty of sleep. Having a strong support network. Spend time with family, friends, or other people you enjoy being around. Express your feelings and talk things over with someone you trust. Counseling or talk therapy with a mental health professional may be helpful if you are havingtrouble managing stress on your own. Follow these instructions at home: Lifestyle  Avoid drugs. Do not use any products that contain nicotine or tobacco, such as cigarettes, e-cigarettes, and chewing tobacco. If you need help quitting, ask your health care provider. Limit alcohol intake to no more than 1 drink a day for nonpregnant women and 2 drinks a day for men. One drink equals 12 oz of beer, 5 oz of wine, or 1 oz of hard liquor Do not use alcohol or drugs to relax. Eat a balanced diet that includes fresh fruits and vegetables, whole grains, lean meats, fish, eggs, and beans, and low-fat dairy. Avoid processed foods and foods high in added fat, sugar, and salt. Exercise at least 30 minutes on 5 or more days each week. Get 7-8 hours of sleep each night.  General instructions  Practice stress management techniques as discussed with your health care provider. Drink enough fluid to keep your urine clear or pale yellow. Take over-the-counter and prescription medicines only as told by your health care provider. Keep all follow-up visits as told by your health care provider. This is important.  Contact a health care provider if: Your symptoms get worse. You have new symptoms. You feel overwhelmed by your problems and can no longer manage them on your own. Get help right away if: You have thoughts of hurting yourself or others. If you ever feel like you may hurt yourself or others, or have thoughts about taking your own life, get help right away. You can go to your nearest emergency department or call: Your local emergency services (911 in the [place] A suicide crisis helpline, such as the [facility] at 4253100524. This is open 24 hours a day. Summary Stress is a normal reaction to life events. It can cause problems if it happens too often or for too long. Practicing stress management techniques is the best way to treat stress. Counseling or talk therapy with a mental health professional may be helpful if you are having trouble managing stress on your own. This information is not intended to replace advice given to you by your health care provider. Make sure you discuss any questions you have with your healthcare provider. Document Revised: 06/29/2020 Document Reviewed: 06/29/2020 Elsevier Patient Education  2022 [employer].

## 2021-05-13 ENCOUNTER — Encounter: Payer: Self-pay | Admitting: Nurse Practitioner

## 2021-05-13 ENCOUNTER — Other Ambulatory Visit: Payer: Self-pay

## 2021-05-13 ENCOUNTER — Ambulatory Visit: Payer: 59 | Admitting: Nurse Practitioner

## 2021-05-13 VITALS — BP 94/66 | HR 70 | Temp 98.6°F | Resp 20 | Ht 62.0 in | Wt 154.1 lb

## 2021-05-13 DIAGNOSIS — F411 Generalized anxiety disorder: Secondary | ICD-10-CM

## 2021-05-13 NOTE — Patient Instructions (Signed)
Textbook of family medicine (9th ed., pp. 1062-1073). [place], [place]: [employer].">  Stress, Adult Stress is a normal reaction to life events. Stress is what you feel when life demands more than you are used to, or more than you think you can handle. Some stress can be useful, such as studying for a test or meeting a deadline at work. Stress that occurs too often or for too long can cause problems. It can affect your emotional health and interfere with relationships and normal daily activities. Too much stress can weaken your body's defense system (immune system) and increase your risk for physical illness. If you already have a medicalproblem, stress can make it worse. What are the causes? All sorts of life events can cause stress. An event that causes stress for one person may not be stressful for another person. Major life events, whether positive or negative, commonly cause stress. Examples include: Losing a job or starting a new job. Losing a loved one. Moving to a new town or home. Getting married or divorced. Having a baby. Getting injured or sick. Less obvious life events can also cause stress, especially if they occur day after day or in combination with each other. Examples include: Working long hours. Driving in traffic. Caring for children. Being in debt. Being in a difficult relationship. What are the signs or symptoms? Stress can cause emotional symptoms, including: Anxiety. This is feeling worried, afraid, on edge, overwhelmed, or out of control. Anger, including irritation or impatience. Depression. This is feeling sad, down, helpless, or guilty. Trouble focusing, remembering, or making decisions. Stress can cause physical symptoms, including: Aches and pains. These may affect your head, neck, back, stomach, or other areas of your body. Tight muscles or a clenched jaw. Low energy. Trouble sleeping. Stress can cause unhealthy behaviors, including: Eating to feel better  (overeating) or skipping meals. Working too much or putting off tasks. Smoking, drinking alcohol, or using drugs to feel better. How is this diagnosed? Stress is diagnosed through an assessment by your health care provider. He or she may diagnose this condition based on: Your symptoms and any stressful life events. Your medical history. Tests to rule out other causes of your symptoms. Depending on your condition, your health care provider may refer you to aspecialist for further evaluation. How is this treated?  Stress management techniques are the recommended treatment for stress. Medicineis not typically recommended for the treatment of stress. Techniques to reduce your reaction to stressful life events include: Stress identification. Monitor yourself for symptoms of stress and identify what causes stress for you. These skills may help you to avoid or prepare for stressful events. Time management. Set your priorities, keep a calendar of events, and learn to say no. Taking these actions can help you avoid making too many commitments. Techniques for coping with stress include: Rethinking the problem. Try to think realistically about stressful events rather than ignoring them or overreacting. Try to find the positives in a stressful situation rather than focusing on the negatives. Exercise. Physical exercise can release both physical and emotional tension. The key is to find a form of exercise that you enjoy and do it regularly. Relaxation techniques. These relax the body and mind. The key is to find one or more that you enjoy and use the techniques regularly. Examples include: Meditation, deep breathing, or progressive relaxation techniques. Yoga or tai chi. Biofeedback, mindfulness techniques, or journaling. Listening to music, being out in nature, or participating in other hobbies. Practicing a healthy lifestyle.   Eat a balanced diet, drink plenty of water, limit or avoid caffeine, and get  plenty of sleep. Having a strong support network. Spend time with family, friends, or other people you enjoy being around. Express your feelings and talk things over with someone you trust. Counseling or talk therapy with a mental health professional may be helpful if you are havingtrouble managing stress on your own. Follow these instructions at home: Lifestyle  Avoid drugs. Do not use any products that contain nicotine or tobacco, such as cigarettes, e-cigarettes, and chewing tobacco. If you need help quitting, ask your health care provider. Limit alcohol intake to no more than 1 drink a day for nonpregnant women and 2 drinks a day for men. One drink equals 12 oz of beer, 5 oz of wine, or 1 oz of hard liquor Do not use alcohol or drugs to relax. Eat a balanced diet that includes fresh fruits and vegetables, whole grains, lean meats, fish, eggs, and beans, and low-fat dairy. Avoid processed foods and foods high in added fat, sugar, and salt. Exercise at least 30 minutes on 5 or more days each week. Get 7-8 hours of sleep each night.  General instructions  Practice stress management techniques as discussed with your health care provider. Drink enough fluid to keep your urine clear or pale yellow. Take over-the-counter and prescription medicines only as told by your health care provider. Keep all follow-up visits as told by your health care provider. This is important.  Contact a health care provider if: Your symptoms get worse. You have new symptoms. You feel overwhelmed by your problems and can no longer manage them on your own. Get help right away if: You have thoughts of hurting yourself or others. If you ever feel like you may hurt yourself or others, or have thoughts about taking your own life, get help right away. You can go to your nearest emergency department or call: Your local emergency services (911 in the [place] A suicide crisis helpline, such as the [facility] at 4253100524. This is open 24 hours a day. Summary Stress is a normal reaction to life events. It can cause problems if it happens too often or for too long. Practicing stress management techniques is the best way to treat stress. Counseling or talk therapy with a mental health professional may be helpful if you are having trouble managing stress on your own. This information is not intended to replace advice given to you by your health care provider. Make sure you discuss any questions you have with your healthcare provider. Document Revised: 06/29/2020 Document Reviewed: 06/29/2020 Elsevier Patient Education  2022 [employer].

## 2021-05-13 NOTE — Progress Notes (Signed)
Subjective:    Patient ID: Haley Clements, female    DOB: 05/08/1981, 40 y.o.   MRN: [id number]   Chief Complaint: Anxiety   HPI Patient was seen on 04/15/21 with anxiety. She had been on rexalti and seroquel in the past for PTSD and that was working well. She stopped meds 3 years ago. Her anxiety has flared up and we started her on celexa 40mg  daily. Since starting medication. She is actual;ly doing much better. She feels fatigued. She takes meds at night prior to bedtime.   GAD 7 : Generalized Anxiety Score 05/13/2021 04/15/2021  Nervous, Anxious, on Edge 1 3  Control/stop worrying 1 2  Worry too much - different things 1 2  Trouble relaxing 1 3  Restless 2 1  Easily annoyed or irritable 1 2  Afraid - awful might happen 0 0  Total GAD 7 Score 7 13  Anxiety Difficulty Somewhat difficult Somewhat difficult   Depression screen [facility] 2/9 05/13/2021 04/15/2021 03/23/2018  Decreased Interest 2 1 1   Down, Depressed, Hopeless 1 0 1  PHQ - 2 Score 3 1 2   Altered sleeping 1 1 1   Tired, decreased energy 3 1 1   Change in appetite 1 0 1  Feeling bad or failure about yourself  0 0 0  Trouble concentrating 0 1 0  Moving slowly or fidgety/restless 0 0 0  Suicidal thoughts 0 0 0  PHQ-9 Score 8 4 5   Difficult doing work/chores Somewhat difficult Somewhat difficult -  Some recent data might be hidden         Review of Systems  Constitutional:  Negative for diaphoresis.  Eyes:  Negative for pain.  Respiratory:  Negative for shortness of breath.   Cardiovascular:  Negative for chest pain, palpitations and leg swelling.  Gastrointestinal:  Negative for abdominal pain.  Endocrine: Negative for polydipsia.  Skin:  Negative for rash.  Neurological:  Negative for dizziness, weakness and headaches.  Hematological:  Does not bruise/bleed easily.  All other systems reviewed and are negative.     Objective:   Physical Exam Vitals and nursing note reviewed.  Constitutional:      Appearance:  Normal appearance.  Cardiovascular:     Rate and Rhythm: Normal rate and regular rhythm.     Heart sounds: Normal heart sounds.  Pulmonary:     Effort: Pulmonary effort is normal.     Breath sounds: Normal breath sounds.  Skin:    General: Skin is warm.  Neurological:     General: No focal deficit present.     Mental Status: She is alert and oriented to person, place, and time.  Psychiatric:        Mood and Affect: Mood normal.        Behavior: Behavior normal.   BP 94/66   Pulse 70   Temp 98.6 F (37 C) (Temporal)   Resp 20   Ht 5\' 2"  (1.575 m)   Wt 154 lb 1.6 oz (69.9 kg)   SpO2 98%   BMI 28.19 kg/m         Assessment & Plan:  Haley Clements in today with chief complaint of Anxiety   1. GAD (generalized anxiety disorder) Continue celexa Stress management   The above assessment and management plan was discussed with the patient. The patient verbalized understanding of and has agreed to the management plan. Patient is aware to call the clinic if symptoms persist or worsen. Patient is aware when to return to the clinic  for a follow-up visit. Patient educated on when it is appropriate to go to the emergency department.   Mary-Margaret Hassell Done, FNP

## 2021-09-09 ENCOUNTER — Other Ambulatory Visit: Payer: Self-pay

## 2021-09-09 ENCOUNTER — Ambulatory Visit (INDEPENDENT_AMBULATORY_CARE_PROVIDER_SITE_OTHER): Payer: 59 | Admitting: Nurse Practitioner

## 2021-09-09 ENCOUNTER — Encounter: Payer: Self-pay | Admitting: Nurse Practitioner

## 2021-09-09 VITALS — BP 104/68 | HR 70 | Temp 98.2°F | Resp 20 | Ht 62.0 in | Wt 173.0 lb

## 2021-09-09 DIAGNOSIS — E7800 Pure hypercholesterolemia, unspecified: Secondary | ICD-10-CM

## 2021-09-09 DIAGNOSIS — F4312 Post-traumatic stress disorder, chronic: Secondary | ICD-10-CM

## 2021-09-09 DIAGNOSIS — R5383 Other fatigue: Secondary | ICD-10-CM | POA: Diagnosis not present

## 2021-09-09 MED ORDER — ESCITALOPRAM OXALATE 20 MG PO TABS: 20.0000 mg | ORAL_TABLET | Freq: Every day | ORAL | 1 refills | Status: DC

## 2021-09-09 NOTE — Progress Notes (Signed)
Subjective:    Patient ID: Haley Clements, female    DOB: 10/04/1981, 40 y.o.   MRN: [id number]  Chief Complaint: Medical Management of Chronic Issues    HPI:  1. Chronic post-traumatic stress disorder (PTSD) Patient has been stressed for many years. She is on celexa daily and that is working well. The $RemoveB'40mg'BzizqlIK$  dose was making her sleepy so she dropped back to the $Remo'20mg'dVFlT$  dose and it does not work as well at that dose.  GAD 7 : Generalized Anxiety Score 09/09/2021 05/13/2021 04/15/2021  Nervous, Anxious, on Edge $Remov'3 1 3  'aCrngh$ Control/stop worrying 0 1 2  Worry too much - different things 0 1 2  Trouble relaxing $RemoveBeforeDE'3 1 3  'vYaYcRglpVCEJhr$ Restless $RemoveBeforeD'1 2 1  'SnsxAKKldfdGYa$ Easily annoyed or irritable $RemoveBefo'2 1 2  'hwqpVLkpaul$ Afraid - awful might happen 0 0 0  Total GAD 7 Score $Remov'9 7 13  'nbxgLO$ Anxiety Difficulty Somewhat difficult Somewhat difficult Somewhat difficult      2. Elevated LDL cholesterol level Does not watch diet very closely. Lab Results  Component Value Date   CHOL 200 (H) 03/23/2018   HDL 47 03/23/2018   LDLCALC 136 (H) 03/23/2018   TRIG 83 03/23/2018   CHOLHDL 4.3 03/23/2018       Outpatient Encounter Medications as of 09/09/2021  Medication Sig   citalopram (CELEXA) 40 MG tablet Take 1 tablet (40 mg total) by mouth daily.   levonorgestrel (MIRENA) 20 MCG/24HR IUD 1 each by Intrauterine route once.   No facility-administered encounter medications on file as of 09/09/2021.    Past Surgical History:  Procedure Laterality Date   NO PAST SURGERIES      Family History  Problem Relation Age of Onset   Diabetes Father    Hypertension Father    Cancer Father    Alcohol abuse Father    Diabetes Maternal Grandmother    Hypertension Maternal Grandmother    COPD Maternal Grandmother    Heart disease Paternal Grandfather    Depression Mother    Anxiety disorder Mother    Hearing loss Neg Hx     New complaints: None today  Social history: Lives with her husband and children  Controlled substance contract: n/a     Review  of Systems  Constitutional:  Negative for diaphoresis.  Eyes:  Negative for pain.  Respiratory:  Negative for shortness of breath.   Cardiovascular:  Negative for chest pain, palpitations and leg swelling.  Gastrointestinal:  Negative for abdominal pain.  Endocrine: Negative for polydipsia.  Skin:  Negative for rash.  Neurological:  Negative for dizziness, weakness and headaches.  Hematological:  Does not bruise/bleed easily.  All other systems reviewed and are negative.     Objective:   Physical Exam Vitals and nursing note reviewed.  Constitutional:      General: She is not in acute distress.    Appearance: Normal appearance. She is well-developed.  Neck:     Vascular: No carotid bruit or JVD.  Cardiovascular:     Rate and Rhythm: Normal rate and regular rhythm.     Heart sounds: Normal heart sounds.  Pulmonary:     Effort: Pulmonary effort is normal. No respiratory distress.     Breath sounds: Normal breath sounds. No wheezing or rales.  Chest:     Chest wall: No tenderness.  Abdominal:     General: Bowel sounds are normal. There is no distension or abdominal bruit.     Palpations: Abdomen is soft. There is no hepatomegaly, splenomegaly,  mass or pulsatile mass.     Tenderness: There is no abdominal tenderness.  Musculoskeletal:        General: Normal range of motion.  Lymphadenopathy:     Cervical: No cervical adenopathy.  Skin:    General: Skin is warm and dry.  Neurological:     Mental Status: She is alert and oriented to person, place, and time.     Deep Tendon Reflexes: Reflexes are normal and symmetric.  Psychiatric:        Behavior: Behavior normal.        Thought Content: Thought content normal.        Judgment: Judgment normal.    BP 104/68   Pulse 70   Temp 98.2 F (36.8 C) (Temporal)   Resp 20   Ht $R'5\' 2"'yB$  (1.575 m)   Wt 173 lb (78.5 kg)   SpO2 98%   BMI 31.64 kg/m        Assessment & Plan:  Haley Clements in today with chief complaint of Medical  Management of Chronic Issues   1. Chronic post-traumatic stress disorder (PTSD) Stop celexa Start lexapro at bedtime- let me know if auses fatigue - escitalopram (LEXAPRO) 20 MG tablet; Take 1 tablet (20 mg total) by mouth daily.  Dispense: 90 tablet; Refill: 1  2. Elevated LDL cholesterol level ;labs pending - CBC with Differential/Platelet - CMP14+EGFR - Lipid panel  3. Other fatigue Labs pending - Thyroid Panel With TSH - VITAMIN D 25 Hydroxy (Vit-D Deficiency, Fractures)    The above assessment and management plan was discussed with the patient. The patient verbalized understanding of and has agreed to the management plan. Patient is aware to call the clinic if symptoms persist or worsen. Patient is aware when to return to the clinic for a follow-up visit. Patient educated on when it is appropriate to go to the emergency department.   Mary-Margaret Hassell Done, FNP

## 2021-09-09 NOTE — Patient Instructions (Signed)

## 2021-09-10 LAB — CMP14+EGFR
ALT: 17 IU/L (ref 0–32)
AST: 16 IU/L (ref 0–40)
Albumin/Globulin Ratio: 2 (ref 1.2–2.2)
Albumin: 4.3 g/dL (ref 3.8–4.8)
Alkaline Phosphatase: 92 IU/L (ref 44–121)
BUN/Creatinine Ratio: 17 (ref 9–23)
BUN: 11 mg/dL (ref 6–20)
Bilirubin Total: 0.4 mg/dL (ref 0.0–1.2)
CO2: 23 mmol/L (ref 20–29)
Calcium: 8.8 mg/dL (ref 8.7–10.2)
Chloride: 105 mmol/L (ref 96–106)
Creatinine, Ser: 0.66 mg/dL (ref 0.57–1.00)
Globulin, Total: 2.2 g/dL (ref 1.5–4.5)
Glucose: 81 mg/dL (ref 70–99)
Potassium: 5.1 mmol/L (ref 3.5–5.2)
Sodium: 139 mmol/L (ref 134–144)
Total Protein: 6.5 g/dL (ref 6.0–8.5)
eGFR: 114 mL/min/{1.73_m2} (ref >59)

## 2021-09-10 LAB — CBC WITH DIFFERENTIAL/PLATELET
Basophils Absolute: 0.1 10*3/uL (ref 0.0–0.2)
Basos: 1 %
EOS (ABSOLUTE): 0.3 10*3/uL (ref 0.0–0.4)
Eos: 3 %
Hematocrit: 43.9 % (ref 34.0–46.6)
Hemoglobin: 14.7 g/dL (ref 11.1–15.9)
Immature Grans (Abs): 0.1 10*3/uL (ref 0.0–0.1)
Immature Granulocytes: 1 %
Lymphocytes Absolute: 2.2 10*3/uL (ref 0.7–3.1)
Lymphs: 25 %
MCH: 30.1 pg (ref 26.6–33.0)
MCHC: 33.5 g/dL (ref 31.5–35.7)
MCV: 90 fL (ref 79–97)
Monocytes Absolute: 0.6 10*3/uL (ref 0.1–0.9)
Monocytes: 7 %
Neutrophils Absolute: 5.5 10*3/uL (ref 1.4–7.0)
Neutrophils: 63 %
Platelets: 313 10*3/uL (ref 150–450)
RBC: 4.89 x10E6/uL (ref 3.77–5.28)
RDW: 11.9 % (ref 11.7–15.4)
WBC: 8.7 10*3/uL (ref 3.4–10.8)

## 2021-09-10 LAB — LIPID PANEL
Chol/HDL Ratio: 4.8 ratio — ABNORMAL HIGH (ref 0.0–4.4)
Cholesterol, Total: 240 mg/dL — ABNORMAL HIGH (ref 100–199)
HDL: 50 mg/dL (ref >39)
LDL Chol Calc (NIH): 161 mg/dL — ABNORMAL HIGH (ref 0–99)
Triglycerides: 162 mg/dL — ABNORMAL HIGH (ref 0–149)
VLDL Cholesterol Cal: 29 mg/dL (ref 5–40)

## 2021-09-10 LAB — THYROID PANEL WITH TSH
Free Thyroxine Index: 1.9 (ref 1.2–4.9)
T3 Uptake Ratio: 27 % (ref 24–39)
T4, Total: 7.2 ug/dL (ref 4.5–12.0)
TSH: 1.02 u[IU]/mL (ref 0.450–4.500)

## 2021-09-10 LAB — VITAMIN D 25 HYDROXY (VIT D DEFICIENCY, FRACTURES): Vit D, 25-Hydroxy: 10.3 ng/mL — ABNORMAL LOW (ref 30.0–100.0)

## 2022-03-25 ENCOUNTER — Ambulatory Visit: Payer: 59 | Admitting: Nurse Practitioner

## 2022-03-28 ENCOUNTER — Ambulatory Visit: Payer: 59 | Admitting: Nurse Practitioner

## 2022-03-31 ENCOUNTER — Encounter: Payer: Self-pay | Admitting: Nurse Practitioner

## 2022-05-15 ENCOUNTER — Encounter: Payer: Self-pay | Admitting: Nurse Practitioner

## 2022-05-15 ENCOUNTER — Ambulatory Visit: Payer: 59 | Admitting: Nurse Practitioner

## 2022-05-15 VITALS — BP 116/71 | HR 67 | Temp 98.5°F | Resp 20 | Ht 62.0 in | Wt 181.0 lb

## 2022-05-15 DIAGNOSIS — F4312 Post-traumatic stress disorder, chronic: Secondary | ICD-10-CM | POA: Diagnosis not present

## 2022-05-15 DIAGNOSIS — E7800 Pure hypercholesterolemia, unspecified: Secondary | ICD-10-CM

## 2022-05-15 MED ORDER — ESCITALOPRAM OXALATE 20 MG PO TABS: 20.0000 mg | ORAL_TABLET | Freq: Every day | ORAL | 1 refills | Status: DC

## 2022-05-15 NOTE — Patient Instructions (Signed)
Post-Traumatic Stress Disorder, Adult Post-traumatic stress disorder (PTSD) is a mental health condition that can occur after a traumatic event, such as a threat to life, a serious injury, sexual violence, or any type of abuse. Sometimes, PTSD can occur in people who hear about trauma that occurs to a close family member or friend. PTSD can happen to anyone at any age. What are the causes? The condition may be caused by experiencing a traumatic event. What increases the risk? This condition is more likely to occur in: People who served in the military. People who are in circumstances where their lives are threatened. People who have been the victim of, or witness to, a traumatic event such as: Domestic violence. Physical or sexual abuse. Rape. A terrorist act or gun violence. A natural or human-caused disaster. Accidents involving serious injury. What are the signs or symptoms? PTSD symptoms may start soon after a frightening event or months or years later. Symptoms last at least one month and tend to disrupt relationships, work, and daily activities. Symptoms of PTSD can be grouped into several categories. Intrusive symptoms This is when you re-experience the physical and emotional sensations of the traumatic event through one or more of the following ways: Having upsetting dreams. Feeling fear, horror, intense sadness, or anger in response to a reminder of the trauma. Having unwanted, upsetting memories while awake. Having physical reactions triggered by reminders of the trauma, such as increased heart rate, shortness of breath, sweating, and shaking. Having flashbacks, or feeling like you are going through the event again. Avoidance symptoms This is when you avoid anything that reminds you of the trauma. Symptoms may also include: Losing interest or not participating in daily activities. Feeling disconnected from or avoiding other people. Isolating yourself. Increased arousal  symptoms You may have physical or emotional reactions triggered by your environment. Symptoms may include: Being easily startled. Behaving in a careless or self-destructive way. Becoming easily irritated. Feeling worried and nervous. Having trouble concentrating. Yelling at or hitting other people or objects. Having trouble sleeping. Negative mood and thoughts Believing that you or others are bad. Feeling fear, horror, anger, sadness, guilt, or shame regularly. Not being able to remember certain parts of the traumatic event. Blaming yourself or others for the trauma. Being unable to experience positive emotions, such as happiness or love. How is this diagnosed? PTSD is diagnosed through an assessment by a mental health professional. You will be asked questions about your symptoms. How is this treated? Treatment for this condition may include any of the following or a combination: Taking medicines to reduce PTSD symptoms. Having counseling with a mental health professional or therapist who is experienced in treating PTSD. Doing eye movement desensitization and reprocessing therapy (EMDR). This type of therapy occurs with a specialized therapist. If you have other mental health concerns, these conditions will also be treated. Follow these instructions at home: Lifestyle Find a support group in your community. Groups are often available for military veterans, trauma victims, and family members or caregivers. Try to get 7-9 hours of sleep each night. To help with sleep: Keep your bedroom cool and dark. Do not eat a heavy meal within 1 hour of bedtime. Do not drink alcohol or caffeinated drinks before bed. Avoid screen time, such as television, computers, tablets, or mobile phones, before bed. Do not useillegal drugs. Contact a local organization to find out if you are eligible for a service dog. Activity Exercise regularly. Try to do at least 30 minutes of physical   activity most days of  the week. Practice self-calming through: Breathing exercises. Meditation. Yoga. Listening to quiet music. Do not isolate yourself. Make connections with other people. Consider volunteering. Volunteering can help you feel more connected. Alcohol use Do not drink alcohol if: Your health care provider tells you not to drink. You are pregnant, may be pregnant, or are planning to become pregnant. If you drink alcohol: Limit how much you have to: 0-1 drink a day for women. 0-2 drinks a day for men. Know how much alcohol is in your drink. In the [place] one drink equals one 12 oz bottle of beer (355 mL), one 5 oz glass of wine (148 mL), or one 1 oz glass of hard liquor (44 mL). General instructions Take steps to help yourself feel safer at home, such as by installing a security system. Work with a health care provider or therapist to help manage your symptoms. Take over-the-counter and prescription medicines only as told by your health care provider. Let others know that you have PTSD and the things that may trigger symptoms. This can protect you and help others understand you better. If your PTSD is affecting your marriage or family, get help from a family therapist. Make sure to let all of your health care providers know you have PTSD. This is especially important if you are having surgery or need to be admitted to the hospital. Keep all follow-up visits. This is important. Where to find more information National Center for PTSD: ptsd.va.gov International Society for Traumatic Stress Studies (ISTSS): istss.org/public-resources Contact a health care provider if: Your symptoms do not get better. You are feeling overwhelmed by your symptoms. Get help right away if: You have thoughts of hurting yourself or others. Get help right away if you feel like you may hurt yourself or others, or have thoughts about taking your own life. Go to your nearest emergency room or: Call 911. Call the National  Suicide Prevention Lifeline at 1-800-273-8255 or 988. This is open 24 hours a day. Text the Crisis Text Line at 741741. Summary Post-traumatic stress disorder (PTSD) is a mental health condition that can occur after a traumatic event. Treatment for PTSD may include medicines, counseling, eye movement desensitization and reprocessing therapy (EMDR), or a combination of therapies. Find a support group in your community. Get help right away if you have thoughts of hurting yourself or others. This information is not intended to replace advice given to you by your health care provider. Make sure you discuss any questions you have with your health care provider. Document Revised: 07/31/2021 Document Reviewed: 08/14/2021 Elsevier Patient Education  2023 Elsevier Inc.  

## 2022-05-15 NOTE — Progress Notes (Signed)
Subjective:    Patient ID: Haley Clements, female    DOB: 1981-06-21, 41 y.o.   MRN: [id number]  Chief Complaint: medical management of chronic issues     HPI:  Haley Clements is a 41 y.o. who identifies as a female who was assigned female at birth.   Social history: Lives with: husband and children Work history: [employer]   Comes in today for follow up of the following chronic medical issues:  1. Chronic post-traumatic stress disorder (PTSD) Patient is on lexapro. Says she still has occasional panic attacks. She does better when her dog is with her. She want sa letter written stating that she needs to bring her dog to work with her.  2. Pure hypercholesterolemia Is not on any statin therapy Lab Results  Component Value Date   CHOL 240 (H) 09/09/2021   HDL 50 09/09/2021   LDLCALC 161 (H) 09/09/2021   TRIG 162 (H) 09/09/2021   CHOLHDL 4.8 (H) 09/09/2021      New complaints: none  Allergies  Allergen Reactions   Strawberry Extract Anaphylaxis   Naproxen Nausea And Vomiting   Outpatient Encounter Medications as of 05/15/2022  Medication Sig   escitalopram (LEXAPRO) 20 MG tablet Take 1 tablet (20 mg total) by mouth daily.   levonorgestrel (MIRENA) 20 MCG/24HR IUD 1 each by Intrauterine route once.   No facility-administered encounter medications on file as of 05/15/2022.    Past Surgical History:  Procedure Laterality Date   NO PAST SURGERIES      Family History  Problem Relation Age of Onset   Diabetes Father    Hypertension Father    Cancer Father    Alcohol abuse Father    Diabetes Maternal Grandmother    Hypertension Maternal Grandmother    COPD Maternal Grandmother    Heart disease Paternal Grandfather    Depression Mother    Anxiety disorder Mother    Hearing loss Neg Hx       Controlled substance contract: n/a     Review of Systems  Constitutional:  Negative for diaphoresis.  Eyes:  Negative for pain.  Respiratory:  Negative for shortness of  breath.   Cardiovascular:  Negative for chest pain, palpitations and leg swelling.  Gastrointestinal:  Negative for abdominal pain.  Endocrine: Negative for polydipsia.  Skin:  Negative for rash.  Neurological:  Negative for dizziness, weakness and headaches.  Hematological:  Does not bruise/bleed easily.  All other systems reviewed and are negative.      Objective:   Physical Exam Vitals and nursing note reviewed.  Constitutional:      General: She is not in acute distress.    Appearance: Normal appearance. She is well-developed.  HENT:     Head: Normocephalic.     Right Ear: Tympanic membrane normal.     Left Ear: Tympanic membrane normal.     Nose: Nose normal.     Mouth/Throat:     Mouth: Mucous membranes are moist.  Eyes:     Pupils: Pupils are equal, round, and reactive to light.  Neck:     Vascular: No carotid bruit or JVD.  Cardiovascular:     Rate and Rhythm: Normal rate and regular rhythm.     Heart sounds: Normal heart sounds.  Pulmonary:     Effort: Pulmonary effort is normal. No respiratory distress.     Breath sounds: Normal breath sounds. No wheezing or rales.  Chest:     Chest wall: No tenderness.  Abdominal:  General: Bowel sounds are normal. There is no distension or abdominal bruit.     Palpations: Abdomen is soft. There is no hepatomegaly, splenomegaly, mass or pulsatile mass.     Tenderness: There is no abdominal tenderness.  Musculoskeletal:        General: Normal range of motion.     Cervical back: Normal range of motion and neck supple.  Lymphadenopathy:     Cervical: No cervical adenopathy.  Skin:    General: Skin is warm and dry.  Neurological:     Mental Status: She is alert and oriented to person, place, and time.     Deep Tendon Reflexes: Reflexes are normal and symmetric.  Psychiatric:        Behavior: Behavior normal.        Thought Content: Thought content normal.        Judgment: Judgment normal.    BP 116/71   Pulse 67    Temp 98.5 F (36.9 C) (Temporal)   Resp 20   Ht $R'5\' 2"'Td$  (1.575 m)   Wt 181 lb (82.1 kg)   SpO2 99%   BMI 33.11 kg/m         Assessment & Plan:   Haley Clements comes in today with chief complaint of Medical Management of Chronic Issues   Diagnosis and orders addressed:  1. Chronic post-traumatic stress disorder (PTSD) Stress management Letter written for patient to be allowed to take her to work - escitalopram (LEXAPRO) 20 MG tablet; Take 1 tablet (20 mg total) by mouth daily.  Dispense: 90 tablet; Refill: 1  2. Pure hypercholesterolemia Low fat diet - CBC with Differential/Platelet - CMP14+EGFR - Lipid panel   Labs pending Health Maintenance reviewed Diet and exercise encouraged  Follow up plan: 6 months   [place], FNP

## 2023-01-19 ENCOUNTER — Telehealth: Payer: Self-pay | Admitting: Nurse Practitioner

## 2023-01-19 NOTE — Telephone Encounter (Signed)
Mirena has changed the time frame on the IUDs and they are now good for 7 years. She can keep it two more years if she wants to. Ok to go ahead and schedule physical

## 2023-01-19 NOTE — Telephone Encounter (Signed)
Please call patient to schedule her annual CPE and removal/re-insertion of IUD.

## 2023-06-30 ENCOUNTER — Ambulatory Visit (INDEPENDENT_AMBULATORY_CARE_PROVIDER_SITE_OTHER): Payer: BC Managed Care – PPO | Admitting: Nurse Practitioner

## 2023-06-30 VITALS — BP 106/66 | HR 67 | Temp 98.1°F | Ht 62.0 in | Wt 176.8 lb

## 2023-06-30 DIAGNOSIS — L237 Allergic contact dermatitis due to plants, except food: Secondary | ICD-10-CM | POA: Diagnosis not present

## 2023-06-30 MED ORDER — CETIRIZINE HCL 10 MG PO TABS: 10.0000 mg | ORAL_TABLET | Freq: Every day | ORAL | 0 refills | Status: DC

## 2023-06-30 MED ORDER — FAMOTIDINE 20 MG PO TABS: 20.0000 mg | ORAL_TABLET | Freq: Two times a day (BID) | ORAL | 0 refills | Status: DC

## 2023-06-30 MED ORDER — METHYLPREDNISOLONE 4 MG PO TBPK: ORAL_TABLET | ORAL | 0 refills | Status: DC

## 2023-06-30 NOTE — Progress Notes (Signed)
Acute Office Visit  Subjective:     Patient ID: Haley Clements, female    DOB: 05/24/1981, 43 y.o.   MRN: [id number]  Chief Complaint  Patient presents with   Poison Ivy    Started itching and broke out last Thursday started on legs and has spread.    HPI The patient is a 42 year old female presenting with a concern about exposure to poison oak. She reports coming into contact with poison oak while gardening approximately three days ago. Since the exposure, she has developed a pruritic rash that is spreading and worsening. The rash initially appeared as small, red bumps but has progressed to larger, blistering areas. She describes significant itching and discomfort, and the rash is localized primarily to her arms and legs, where the contact occurred. She denies any systemic symptoms such as fever or chills. The patient is seeking treatment to alleviate her symptoms and prevent further spread of the rash.  The plan includes a thorough examination of the rash, recommendations for symptomatic relief (such as topical corticosteroids or antihistamines), and education on how to prevent further exposure. If necessary, a referral to a dermatologist may be considered. Follow-up will be scheduled to assess the progression of the rash and adjust treatment as needed.   Review of Systems  Constitutional:  Negative for chills and fever.  Respiratory:  Negative for shortness of breath and wheezing.   Cardiovascular:  Negative for chest pain and leg swelling.  Gastrointestinal:  Negative for nausea and vomiting.  Musculoskeletal:  Negative for falls and myalgias.  Skin:  Positive for itching and rash.  Neurological:  Negative for dizziness, weakness and headaches.  Endo/Heme/Allergies:  Negative for environmental allergies and polydipsia. Does not bruise/bleed easily.   Negative unless indicated in HPI    Objective:    BP 106/66   Pulse 67   Temp 98.1 F (36.7 C) (Temporal)   Ht 5\' 2"  (1.575 m)    Wt 176 lb 12.8 oz (80.2 kg)   SpO2 99%   BMI 32.34 kg/m  BP Readings from Last 3 Encounters:  06/30/23 106/66  05/15/22 116/71  09/09/21 104/68   Wt Readings from Last 3 Encounters:  06/30/23 176 lb 12.8 oz (80.2 kg)  05/15/22 181 lb (82.1 kg)  09/09/21 173 lb (78.5 kg)      Physical Exam Vitals and nursing note reviewed.  HENT:     Head: Normocephalic and atraumatic.  Eyes:     General: No scleral icterus.    Extraocular Movements: Extraocular movements intact.     Conjunctiva/sclera: Conjunctivae normal.     Pupils: Pupils are equal, round, and reactive to light.  Cardiovascular:     Rate and Rhythm: Normal rate and regular rhythm.  Pulmonary:     Effort: Pulmonary effort is normal.     Breath sounds: Normal breath sounds.  Skin:    General: Skin is warm and dry.     Findings: Rash present. Rash is purpuric.     Comments: Bilateral lower extremities and upper extremities  Neurological:     Mental Status: She is alert and oriented to person, place, and time. Mental status is at baseline.  Psychiatric:        Mood and Affect: Mood normal.        Behavior: Behavior normal.        Thought Content: Thought content normal.        Judgment: Judgment normal.     No results found for any visits on  06/30/23.  LMP 10 yrs ago, has [place] implant    Assessment & Plan:  Contact dermatitis due to poison ivy -     methylPREDNISolone; Follow instructions on the box  Dispense: 21 tablet; Refill: 0 -     Famotidine; Take 1 tablet (20 mg total) by mouth 2 (two) times daily.  Dispense: 30 tablet; Refill: 0 -     Cetirizine HCl; Take 1 tablet (10 mg total) by mouth daily.  Dispense: 30 tablet; Refill: 0   Haley Clements is 42 yrs old caucasian female, no acute distress Poison Oak: Medrol dose pack, Pepcid and cefatrizine Continue OTC hydrocortisone Wear long pants and logs sleeve when out gardening   The above assessment and management plan was discussed with the patient. The patient  verbalized understanding of and has agreed to the management plan. Patient is aware to call the clinic if they develop any new symptoms or if symptoms persist or worsen. Patient is aware when to return to the clinic for a follow-up visit. Patient educated on when it is appropriate to go to the emergency department.  Return if symptoms worsen or fail to improve.  Arrie Aran Santa Lighter, DNP [facility] [address] [place], [place] [address] [phone]

## 2023-07-28 ENCOUNTER — Other Ambulatory Visit (HOSPITAL_COMMUNITY)
Admission: RE | Admit: 2023-07-28 | Discharge: 2023-07-28 | Disposition: A | Discharge status: Home / Self Care | Payer: BC Managed Care – PPO | Source: Ambulatory Visit | Attending: Nurse Practitioner | Admitting: Nurse Practitioner

## 2023-07-28 ENCOUNTER — Encounter: Payer: Self-pay | Admitting: Nurse Practitioner

## 2023-07-28 ENCOUNTER — Ambulatory Visit (INDEPENDENT_AMBULATORY_CARE_PROVIDER_SITE_OTHER): Payer: Self-pay | Admitting: Nurse Practitioner

## 2023-07-28 VITALS — BP 109/74 | HR 71 | Temp 98.2°F | Resp 20 | Ht 62.0 in | Wt 179.0 lb

## 2023-07-28 DIAGNOSIS — Z0001 Encounter for general adult medical examination with abnormal findings: Secondary | ICD-10-CM

## 2023-07-28 DIAGNOSIS — E7800 Pure hypercholesterolemia, unspecified: Secondary | ICD-10-CM | POA: Diagnosis not present

## 2023-07-28 DIAGNOSIS — Z0000 Encounter for general adult medical examination without abnormal findings: Secondary | ICD-10-CM | POA: Insufficient documentation

## 2023-07-28 DIAGNOSIS — F4312 Post-traumatic stress disorder, chronic: Secondary | ICD-10-CM

## 2023-07-28 LAB — LIPID PANEL

## 2023-07-28 MED ORDER — ESCITALOPRAM OXALATE 20 MG PO TABS: 20.0000 mg | ORAL_TABLET | Freq: Every day | ORAL | 1 refills | Status: AC

## 2023-07-28 NOTE — Progress Notes (Addendum)
Subjective:    Patient ID: Haley Clements, female    DOB: [birth year], 42 y.o.   MRN: [id number]   Chief Complaint: annual physical   HPI:  Haley Clements is a 42 y.o. who identifies as a female who was assigned female at birth.   Social history: Lives with: husband and kids Work history: [employer]   Comes in today for follow up of the following chronic medical issues:  1. Annual physical exam With PAP  2. Pure hypercholesterolemia Does not watch diet and does no exercise currently. Lab Results  Component Value Date   CHOL 240 (H) 09/09/2021   HDL 50 09/09/2021   LDLCALC 161 (H) 09/09/2021   TRIG 162 (H) 09/09/2021   CHOLHDL 4.8 (H) 09/09/2021  The 10-year ASCVD risk score (Arnett DK, et al., 2019) is: 0.8%    3. Chronic post-traumatic stress disorder (PTSD) She was  on lexapro and was doing well. She stopped it several months ago and now her stress level has increased. Would like to start back on meds.    06/30/2023    9:13 AM 05/15/2022    3:03 PM 09/09/2021   12:26 PM  Depression screen PHQ 2/9  Decreased Interest 2 2 1   Down, Depressed, Hopeless 3 3 1   PHQ - 2 Score 5 5 2   Altered sleeping 3 3 2   Tired, decreased energy 2 3 3   Change in appetite 2 3 1   Feeling bad or failure about yourself  0 3 0  Trouble concentrating 3 1 2   Moving slowly or fidgety/restless 0 0 0  Suicidal thoughts 0 0 0  PHQ-9 Score 15 18 10   Difficult doing work/chores Very difficult Extremely dIfficult Somewhat difficult      New complaints: None today  Allergies  Allergen Reactions   Strawberry Extract Anaphylaxis   Naproxen Nausea And Vomiting   Outpatient Encounter Medications as of 07/28/2023  Medication Sig   cetirizine (ZYRTEC ALLERGY) 10 MG tablet Take 1 tablet (10 mg total) by mouth daily.   escitalopram (LEXAPRO) 20 MG tablet Take 1 tablet (20 mg total) by mouth daily. (Patient not taking: Reported on 06/30/2023)   famotidine (PEPCID) 20 MG tablet Take 1 tablet (20 mg total) by  mouth 2 (two) times daily.   levonorgestrel (MIRENA) 20 MCG/24HR IUD 1 each by Intrauterine route once.   methylPREDNISolone (MEDROL DOSEPAK) 4 MG TBPK tablet Follow instructions on the box   No facility-administered encounter medications on file as of 07/28/2023.    Past Surgical History:  Procedure Laterality Date   NO PAST SURGERIES      Family History  Problem Relation Age of Onset   Diabetes Father    Hypertension Father    Cancer Father    Alcohol abuse Father    Diabetes Maternal Grandmother    Hypertension Maternal Grandmother    COPD Maternal Grandmother    Heart disease Paternal Grandfather    Depression Mother    Anxiety disorder Mother    Hearing loss Neg Hx       Controlled substance contract: n/a     Review of Systems  Constitutional:  Negative for diaphoresis.  Eyes:  Negative for pain.  Respiratory:  Negative for shortness of breath.   Cardiovascular:  Negative for chest pain, palpitations and leg swelling.  Gastrointestinal:  Negative for abdominal pain.  Endocrine: Negative for polydipsia.  Skin:  Negative for rash.  Neurological:  Negative for dizziness, weakness and headaches.  Hematological:  Does not bruise/bleed easily.  All other systems reviewed and are negative.      Objective:   Physical Exam Vitals and nursing note reviewed.  Constitutional:      General: She is not in acute distress.    Appearance: Normal appearance. She is well-developed.  HENT:     Head: Normocephalic.     Right Ear: Tympanic membrane normal.     Left Ear: Tympanic membrane normal.     Nose: Nose normal.     Mouth/Throat:     Mouth: Mucous membranes are moist.  Eyes:     Pupils: Pupils are equal, round, and reactive to light.  Neck:     Vascular: No carotid bruit or JVD.  Cardiovascular:     Rate and Rhythm: Normal rate and regular rhythm.     Heart sounds: Normal heart sounds.  Pulmonary:     Effort: Pulmonary effort is normal. No respiratory  distress.     Breath sounds: Normal breath sounds. No wheezing or rales.  Chest:     Chest wall: No tenderness.  Abdominal:     General: Bowel sounds are normal. There is no distension or abdominal bruit.     Palpations: Abdomen is soft. There is no hepatomegaly, splenomegaly, mass or pulsatile mass.     Tenderness: There is no abdominal tenderness.  Genitourinary:    General: Normal vulva.     Vagina: No vaginal discharge.     Rectum: Normal.     Comments: Cervix parous and pink No adnexal masses or tenderness Musculoskeletal:        General: Normal range of motion.     Cervical back: Normal range of motion and neck supple.  Lymphadenopathy:     Cervical: No cervical adenopathy.  Skin:    General: Skin is warm and dry.  Neurological:     Mental Status: She is alert and oriented to person, place, and time.     Deep Tendon Reflexes: Reflexes are normal and symmetric.  Psychiatric:        Behavior: Behavior normal.        Thought Content: Thought content normal.        Judgment: Judgment normal.    BP 109/74   Pulse 71   Temp 98.2 F (36.8 C) (Temporal)   Resp 20   Ht 5\' 2"  (1.575 m)   Wt 179 lb (81.2 kg)   SpO2 99%   BMI 32.74 kg/m         Assessment & Plan:   Haley Clements in today with chief complaint of Annual Exam   1. Annual physical exam Labs oending - CBC with Differential/Platelet - Thyroid Panel With TSH - Cytology - PAP  2. Pure hypercholesterolemia Low fat diet encouraged exercise - CMP14+EGFR - Lipid panel  3. Chronic post-traumatic stress disorder (PTSD) Stress management - escitalopram (LEXAPRO) 20 MG tablet; Take 1 tablet (20 mg total) by mouth daily.  Dispense: 90 tablet; Refill: 1    The above assessment and management plan was discussed with the patient. The patient verbalized understanding of and has agreed to the management plan. Patient is aware to call the clinic if symptoms persist or worsen. Patient is aware when to return to the  clinic for a follow-up visit. Patient educated on when it is appropriate to go to the emergency department.   Mary-Margaret Daphine Deutscher, FNP

## 2023-07-29 LAB — CMP14+EGFR
ALT: 18 IU/L (ref 0–32)
AST: 19 IU/L (ref 0–40)
Albumin: 4.1 g/dL (ref 3.9–4.9)
Alkaline Phosphatase: 87 IU/L (ref 44–121)
BUN/Creatinine Ratio: 11 (ref 9–23)
BUN: 8 mg/dL (ref 6–24)
Bilirubin Total: 0.6 mg/dL (ref 0.0–1.2)
CO2: 22 mmol/L (ref 20–29)
Calcium: 9.1 mg/dL (ref 8.7–10.2)
Chloride: 104 mmol/L (ref 96–106)
Creatinine, Ser: 0.72 mg/dL (ref 0.57–1.00)
Globulin, Total: 2.3 g/dL (ref 1.5–4.5)
Glucose: 89 mg/dL (ref 70–99)
Potassium: 4.6 mmol/L (ref 3.5–5.2)
Sodium: 140 mmol/L (ref 134–144)
Total Protein: 6.4 g/dL (ref 6.0–8.5)
eGFR: 108 mL/min/{1.73_m2} (ref >59)

## 2023-07-29 LAB — CBC WITH DIFFERENTIAL/PLATELET
Basophils Absolute: 0.1 10*3/uL (ref 0.0–0.2)
Basos: 1 %
EOS (ABSOLUTE): 0.2 10*3/uL (ref 0.0–0.4)
Eos: 3 %
Hematocrit: 43.3 % (ref 34.0–46.6)
Hemoglobin: 14 g/dL (ref 11.1–15.9)
Immature Grans (Abs): 0.1 10*3/uL (ref 0.0–0.1)
Immature Granulocytes: 1 %
Lymphocytes Absolute: 2.1 10*3/uL (ref 0.7–3.1)
Lymphs: 30 %
MCH: 29 pg (ref 26.6–33.0)
MCHC: 32.3 g/dL (ref 31.5–35.7)
MCV: 90 fL (ref 79–97)
Monocytes Absolute: 0.5 10*3/uL (ref 0.1–0.9)
Monocytes: 7 %
Neutrophils Absolute: 4.2 10*3/uL (ref 1.4–7.0)
Neutrophils: 58 %
Platelets: 304 10*3/uL (ref 150–450)
RBC: 4.82 x10E6/uL (ref 3.77–5.28)
RDW: 12.3 % (ref 11.7–15.4)
WBC: 7.2 10*3/uL (ref 3.4–10.8)

## 2023-07-29 LAB — LIPID PANEL
Cholesterol, Total: 227 mg/dL — ABNORMAL HIGH (ref 100–199)
HDL: 45 mg/dL (ref >39)
LDL CALC COMMENT:: 5 ratio — ABNORMAL HIGH (ref 0.0–4.4)
LDL Chol Calc (NIH): 153 mg/dL — ABNORMAL HIGH (ref 0–99)
Triglycerides: 160 mg/dL — ABNORMAL HIGH (ref 0–149)
VLDL Cholesterol Cal: 29 mg/dL (ref 5–40)

## 2023-07-29 LAB — THYROID PANEL WITH TSH
Free Thyroxine Index: 2 (ref 1.2–4.9)
T3 Uptake Ratio: 26 % (ref 24–39)
T4, Total: 7.7 ug/dL (ref 4.5–12.0)
TSH: 1.33 u[IU]/mL (ref 0.450–4.500)

## 2023-08-03 LAB — CYTOLOGY - PAP
Adequacy: ABSENT
Comment: NEGATIVE
Diagnosis: NEGATIVE
High risk HPV: NEGATIVE

## 2023-08-03 MED ORDER — FLUCONAZOLE 150 MG PO TABS: 150.0000 mg | ORAL_TABLET | Freq: Once | ORAL | 0 refills | Status: AC

## 2023-08-03 NOTE — Addendum Note (Signed)
Addended by: Bennie Pierini on: 08/03/2023 01:44 PM   Modules accepted: Orders

## 2024-01-26 ENCOUNTER — Ambulatory Visit: Payer: BC Managed Care – PPO | Admitting: Nurse Practitioner

## 2024-07-02 DIAGNOSIS — R21 Rash and other nonspecific skin eruption: Secondary | ICD-10-CM | POA: Diagnosis not present

## 2024-07-02 DIAGNOSIS — L509 Urticaria, unspecified: Secondary | ICD-10-CM | POA: Diagnosis not present

## 2024-08-15 ENCOUNTER — Ambulatory Visit (INDEPENDENT_AMBULATORY_CARE_PROVIDER_SITE_OTHER): Admitting: Nurse Practitioner

## 2024-08-15 ENCOUNTER — Ambulatory Visit: Payer: Self-pay

## 2024-08-15 VITALS — BP 109/65 | HR 69 | Temp 97.2°F | Ht 62.0 in | Wt 179.2 lb

## 2024-08-15 DIAGNOSIS — R112 Nausea with vomiting, unspecified: Secondary | ICD-10-CM | POA: Insufficient documentation

## 2024-08-15 DIAGNOSIS — R197 Diarrhea, unspecified: Secondary | ICD-10-CM | POA: Insufficient documentation

## 2024-08-15 MED ORDER — LOPERAMIDE HCL 2 MG PO TABS
2.0000 mg | ORAL_TABLET | Freq: Four times a day (QID) | ORAL | 0 refills | Status: AC | PRN
Start: 2024-08-15 — End: ?

## 2024-08-15 MED ORDER — ONDANSETRON HCL 4 MG PO TABS: 4.0000 mg | ORAL_TABLET | Freq: Three times a day (TID) | ORAL | 0 refills | Status: AC | PRN

## 2024-08-15 NOTE — Telephone Encounter (Signed)
 FYI Only or Action Required?: FYI only for provider.  Patient was last seen in primary care on 07/28/2023 by Gladis Mustard, FNP.  Called Nurse Triage reporting Dizziness.  Symptoms began several days ago.  Interventions attempted: OTC medications: Aleve.  Symptoms are: unchanged.  Triage Disposition: See Physician Within 24 Hours  Patient/caregiver understands and will follow disposition?: Yes   **Appt. Scheduled for 10/20**          Copied from CRM #[id number]. Topic: Clinical - Red Word Triage >> Aug 15, 2024  8:08 AM Alfonso ORN wrote: Red Word that prompted transfer to Nurse Triage: last night rate 9 miagraine , when sit up light headed l,right now do not have the pain head is just sore  , with upset stomach  nausea and uncontrollable diarreha Reason for Disposition  [1] MODERATE dizziness (e.g., interferes with normal activities) AND [2] has NOT been evaluated by doctor (or NP/PA) for this  (Exception: Dizziness caused by heat exposure, sudden standing, or poor fluid intake.)  Answer Assessment - Initial Assessment Questions 1. DESCRIPTION: Describe your dizziness.     Upon standing she feels dizzy, with a headache  2. LIGHTHEADED: Do you feel lightheaded? (e.g., somewhat faint, woozy, weak upon standing)     Upon standing   3. VERTIGO: Do you feel like either you or the room is spinning or tilting? (i.e., vertigo)     No   4. SEVERITY: How bad is it?  Do you feel like you are going to faint? Can you stand and walk?     She denies feeling faint, rates severity as mild   5. ONSET:  When did the dizziness begin?     X 3 days   6. AGGRAVATING FACTORS: Does anything make it worse? (e.g., standing, change in head position)     Standing   7. HEART RATE: Can you tell me your heart rate? How many beats in 15 seconds?  (Note: Not all patients can do this.)       No   8. CAUSE: What do you think is causing the dizziness? (e.g., decreased fluids  or food, diarrhea, emotional distress, heat exposure, new medicine, sudden standing, vomiting; unknown)     She's suspects the migraines x 3 days  9. RECURRENT SYMPTOM: Have you had dizziness before? If Yes, ask: When was the last time? What happened that time?     Yes, each time when she gets migraines   10. OTHER SYMPTOMS: Do you have any other symptoms? (e.g., fever, chest pain, vomiting, diarrhea, bleeding)    diarrhea x 3 days, no vomiting, nausea present   Patient is taking Aleve for the headache. Appt. Scheduled for 10/20  Protocols used: Dizziness - Lightheadedness-A-AH

## 2024-08-15 NOTE — Telephone Encounter (Signed)
 Appt today

## 2024-08-15 NOTE — Progress Notes (Signed)
 Subjective:  Patient ID: Haley Clements, female    DOB: 1981/05/07, 43 y.o.   MRN: [id number]  Patient Care Team: Gladis Mustard, FNP as PCP - General (Family Medicine)   Chief Complaint:  Dizziness (Symptoms for 3 days ), Headache, and Diarrhea   HPI: Haley Clements is a 43 y.o. female presenting on 08/15/2024 for Dizziness (Symptoms for 3 days ), Headache, and Diarrhea   Discussed the use of AI scribe software for clinical note transcription with the patient, who gave verbal consent to proceed.  History of Present Illness Haley Clements is a 43 year old female who presents with a three-day history of headache and diarrhea.  She has been experiencing a headache for the past three days, which is unusual for her as she typically has migraines about once a month. She is not currently taking any medication for migraines.  In addition to the headache, she has had watery diarrhea for the same duration. The diarrhea occurs after eating or drinking, leading her to limit her intake. She last experienced diarrhea this morning after drinking orange juice. No fever is present, but she reports night sweats despite using air conditioning and a fan. No blood in the diarrhea and she has not been around anyone who is sick.  She has vomited four times in the last three days, with the most recent episode occurring before the visit. She attributes the vomiting to feeling lightheaded and hot after getting up and moving around. She reports a weight loss from 185.6 pounds to 179 pounds over the past week, which she attributes to the inability to retain food or fluids.  She is not currently taking any medications except for an IUD. She previously took Lexapro  20 mg daily but stopped because she felt it was ineffective for her PTSD. She has tried various therapies, including EMDR, for PTSD management.  She works a physical job Teacher, adult education, which causes some muscle aches, but she does not  report any unusual body aches or muscle pain beyond this.  Relevant past medical, surgical, family, and social history reviewed and updated as indicated.  Allergies and medications reviewed and updated. Data reviewed: Chart in Epic.   Past Medical History:  Diagnosis Date   Anxiety    Depression    no meds with preg, doing ok   Eczema    Gallstone    Headache(784.0)    Seizures (HCC)    during puberty    Past Surgical History:  Procedure Laterality Date   NO PAST SURGERIES      Social History   Socioeconomic History   Marital status: Married    Spouse name: Not on file   Number of children: Not on file   Years of education: Not on file   Highest education level: Associate degree: occupational, Scientist, product/process development, or vocational program  Occupational History   Not on file  Tobacco Use   Smoking status: Former   Smokeless tobacco: Never   Tobacco comments:    quit 2005  Vaping Use   Vaping status: Never Used  Substance and Sexual Activity   Alcohol use: No    Comment: occ, not with preg   Drug use: No   Sexual activity: Yes    Birth control/protection: None  Other Topics Concern   Not on file  Social History Narrative   Not on file   Social Drivers of Health   Financial Resource Strain: Low Risk  (08/15/2024)   Overall Financial  Resource Strain (CARDIA)    Difficulty of Paying Living Expenses: Not hard at all  Food Insecurity: No Food Insecurity (08/15/2024)   Hunger Vital Sign    Worried About Running Out of Food in the Last Year: Never true    Ran Out of Food in the Last Year: Never true  Transportation Needs: No Transportation Needs (08/15/2024)   PRAPARE - Administrator, Civil Service (Medical): No    Lack of Transportation (Non-Medical): No  Physical Activity: Sufficiently Active (08/15/2024)   Exercise Vital Sign    Days of Exercise per Week: 7 days    Minutes of Exercise per Session: 40 min  Stress: Stress Concern Present (08/15/2024)    [employer] of Occupational Health - Occupational Stress Questionnaire    Feeling of Stress: Rather much  Social Connections: Socially Isolated (08/15/2024)   Social Connection and Isolation Panel    Frequency of Communication with Friends and Family: Never    Frequency of Social Gatherings with Friends and Family: Never    Attends Religious Services: Never    Database administrator or Organizations: No    Attends Banker Meetings: Not on file    Marital Status: Married  Intimate Partner Violence: Not on file    Outpatient Encounter Medications as of 08/15/2024  Medication Sig   levonorgestrel  (MIRENA ) 20 MCG/24HR IUD 1 each by Intrauterine route once.   loperamide (IMODIUM A-D) 2 MG tablet Take 1 tablet (2 mg total) by mouth 4 (four) times daily as needed for diarrhea or loose stools.   ondansetron  (ZOFRAN ) 4 MG tablet Take 1 tablet (4 mg total) by mouth every 8 (eight) hours as needed for nausea or vomiting.   escitalopram  (LEXAPRO ) 20 MG tablet Take 1 tablet (20 mg total) by mouth daily. (Patient not taking: Reported on 08/15/2024)   No facility-administered encounter medications on file as of 08/15/2024.    Allergies  Allergen Reactions   Strawberry Extract Anaphylaxis   Naproxen Nausea And Vomiting    Pertinent ROS per HPI, otherwise unremarkable      Objective:  BP 109/65   Pulse 69   Temp (!) 97.2 F (36.2 C) (Temporal)   Ht 5' 2 (1.575 m)   Wt 179 lb 3.2 oz (81.3 kg)   SpO2 96%   BMI 32.78 kg/m    Wt Readings from Last 3 Encounters:  08/15/24 179 lb 3.2 oz (81.3 kg)  07/28/23 179 lb (81.2 kg)  06/30/23 176 lb 12.8 oz (80.2 kg)   BP Readings from Last 3 Encounters:  08/15/24 109/65  07/28/23 109/74  06/30/23 106/66     Physical Exam Vitals and nursing note reviewed.  Constitutional:      General: She is not in acute distress. HENT:     Head: Normocephalic and atraumatic.     Nose: Nose normal.     Mouth/Throat:     Mouth:  Mucous membranes are moist.  Eyes:     General: No scleral icterus.    Extraocular Movements: Extraocular movements intact.     Conjunctiva/sclera: Conjunctivae normal.     Pupils: Pupils are equal, round, and reactive to light.  Cardiovascular:     Heart sounds: Normal heart sounds.  Pulmonary:     Effort: Pulmonary effort is normal.     Breath sounds: Normal breath sounds.  Abdominal:     General: Bowel sounds are normal. There is no distension.     Palpations: Abdomen is soft.  Tenderness: There is no abdominal tenderness. There is no right CVA tenderness or left CVA tenderness.  Musculoskeletal:        General: Normal range of motion.     Right lower leg: No edema.     Left lower leg: No edema.  Skin:    General: Skin is warm and dry.     Findings: No rash.  Neurological:     Mental Status: She is alert and oriented to person, place, and time.  Psychiatric:        Mood and Affect: Mood normal.        Behavior: Behavior normal.        Thought Content: Thought content normal.        Judgment: Judgment normal.    Physical Exam MEASUREMENTS: Weight- 179.     Results for orders placed or performed in visit on 07/28/23  Cytology - PAP   Collection Time: 07/28/23 10:05 AM  Result Value Ref Range   High risk HPV Negative    Adequacy      Satisfactory for evaluation; transformation zone component ABSENT.   Diagnosis      - Negative for intraepithelial lesion or malignancy (NILM)   Microorganisms      Fungal organisms present consistent with Candida spp.   Comment Normal Reference Range HPV - Negative   CBC with Differential/Platelet   Collection Time: 07/28/23 10:20 AM  Result Value Ref Range   WBC 7.2 3.4 - 10.8 x10E3/uL   RBC 4.82 3.77 - 5.28 x10E6/uL   Hemoglobin 14.0 11.1 - 15.9 g/dL   Hematocrit 56.6 65.9 - 46.6 %   MCV 90 79 - 97 fL   MCH 29.0 26.6 - 33.0 pg   MCHC 32.3 31.5 - 35.7 g/dL   RDW 87.6 88.2 - 84.5 %   Platelets 304 150 - 450 x10E3/uL    Neutrophils 58 Not Estab. %   Lymphs 30 Not Estab. %   Monocytes 7 Not Estab. %   Eos 3 Not Estab. %   Basos 1 Not Estab. %   Neutrophils Absolute 4.2 1.4 - 7.0 x10E3/uL   Lymphocytes Absolute 2.1 0.7 - 3.1 x10E3/uL   Monocytes Absolute 0.5 0.1 - 0.9 x10E3/uL   EOS (ABSOLUTE) 0.2 0.0 - 0.4 x10E3/uL   Basophils Absolute 0.1 0.0 - 0.2 x10E3/uL   Immature Granulocytes 1 Not Estab. %   Immature Grans (Abs) 0.1 0.0 - 0.1 x10E3/uL  CMP14+EGFR   Collection Time: 07/28/23 10:20 AM  Result Value Ref Range   Glucose 89 70 - 99 mg/dL   BUN 8 6 - 24 mg/dL   Creatinine, Ser 9.27 0.57 - 1.00 mg/dL   eGFR 891 >40 fO/fpw/8.26   BUN/Creatinine Ratio 11 9 - 23   Sodium 140 134 - 144 mmol/L   Potassium 4.6 3.5 - 5.2 mmol/L   Chloride 104 96 - 106 mmol/L   CO2 22 20 - 29 mmol/L   Calcium 9.1 8.7 - 10.2 mg/dL   Total Protein 6.4 6.0 - 8.5 g/dL   Albumin 4.1 3.9 - 4.9 g/dL   Globulin, Total 2.3 1.5 - 4.5 g/dL   Bilirubin Total 0.6 0.0 - 1.2 mg/dL   Alkaline Phosphatase 87 44 - 121 IU/L   AST 19 0 - 40 IU/L   ALT 18 0 - 32 IU/L  Lipid panel   Collection Time: 07/28/23 10:20 AM  Result Value Ref Range   Cholesterol, Total 227 (H) 100 - 199 mg/dL   Triglycerides 839 (  H) 0 - 149 mg/dL   HDL 45 >60 mg/dL   VLDL Cholesterol Cal 29 5 - 40 mg/dL   LDL Chol Calc (NIH) 846 (H) 0 - 99 mg/dL   Chol/HDL Ratio 5.0 (H) 0.0 - 4.4 ratio  Thyroid  Panel With TSH   Collection Time: 07/28/23 10:20 AM  Result Value Ref Range   TSH 1.330 0.450 - 4.500 uIU/mL   T4, Total 7.7 4.5 - 12.0 ug/dL   T3 Uptake Ratio 26 24 - 39 %   Free Thyroxine Index 2.0 1.2 - 4.9       Pertinent labs & imaging results that were available during my care of the patient were reviewed by me and considered in my medical decision making.  Assessment & Plan:  Haley Clements was seen today for dizziness, headache and diarrhea.  Diagnoses and all orders for this visit:  Diarrhea, unspecified type -     Stool culture -     loperamide (IMODIUM  A-D) 2 MG tablet; Take 1 tablet (2 mg total) by mouth 4 (four) times daily as needed for diarrhea or loose stools.  Nausea and vomiting, unspecified vomiting type -     ondansetron  (ZOFRAN ) 4 MG tablet; Take 1 tablet (4 mg total) by mouth every 8 (eight) hours as needed for nausea or vomiting.     Assessment and Plan Haley Clements is a 43 year old Caucasian female seen today for diarrhea, no acute distress Assessment & Plan Acute diarrhea with dehydration and vomiting Acute diarrhea with dehydration and vomiting for three days, significant weight loss. - Order stool culture. - Prescribe Imodium 1 tablet TID PRN for diarrhea. - Advise bland diet: no grease, sugar, or alcohol; include rice, oatmeal, bananas, applesauce until stools normalize. - Recommend OTC probiotics. - Prescribe Zofran  for nausea.  Migraine without aura Increased frequency to three days of continuous headache, no current migraine medication. - Recommend OTC Excedrin Migraine  - Advise discussing migraine frequency and treatment options with PCP for potential prescription medication.  Post-traumatic stress disorder (PTSD) PTSD with previous therapy, not on Lexapro  due to perceived ineffectiveness. - Encourage discussion with therapist about EMDR therapy for PTSD management.      Continue all other maintenance medications.  Follow up plan: Return if symptoms worsen or fail to improve.   Continue healthy lifestyle choices, including diet (rich in fruits, vegetables, and lean proteins, and low in salt and simple carbohydrates) and exercise (at least 30 minutes of moderate physical activity daily).  Educational handout given for    Clinical References  Food Choices to Help Relieve Diarrhea, Adult Diarrhea can make you feel weak and cause you to become dehydrated. Dehydration is a condition in which there is not enough water or other fluids in the body. It is important to choose the right foods and drinks to: Relieve  diarrhea. Replace lost fluids and nutrients. Prevent dehydration. What are tips for following this plan? Relieving diarrhea Avoid foods that make your diarrhea worse. These may include: Foods and drinks that are sweetened with high-fructose corn syrup, honey, or sweeteners such as xylitol, sorbitol, and mannitol. Check food labels for these ingredients. Fried, greasy, or spicy foods. Raw fruits and vegetables. Eat foods that are rich in probiotics. These include foods such as yogurt and fermented milk products. Probiotics can help increase healthy bacteria in your stomach and intestines (gastrointestinal or GI tract). This may help digestion and stop diarrhea. If you have lactose intolerance, avoid dairy products. These may make your diarrhea worse. Take medicine  to help stop diarrhea only as told by your health care provider. Replacing nutrients  Eat bland, easy-to-digest foods in small amounts as you are able, until your diarrhea starts to get better. These foods include bananas, applesauce, rice, toast, and crackers. Over time, add nutrient-rich foods as your body tolerates them or as told by your health care provider. These include: Well-cooked protein foods, such as eggs, lean meats like fish or chicken without skin, and tofu. Peeled, seeded, and soft-cooked fruits and vegetables. Low-fat dairy products. Whole grains. Take vitamin and mineral supplements as told by your health care provider. Preventing dehydration  Start by sipping water or a solution to prevent dehydration (oral rehydration solution, or ORS). This is a drink that helps replace fluids and minerals your body has lost. You can buy an ORS at pharmacies and retail stores. Try to drink at least 8-10 cups (2,000-2,500 mL) of fluid each day to help replace lost fluids. If your urine is pale yellow, you are getting enough fluids. You may drink other liquids in addition to water, such as fruit juice that you have added water to  (diluted fruit juice) or low-calorie sports drinks, as tolerated or as told by your health care provider. Avoid drinks with caffeine, such as coffee, tea, or soft drinks. Avoid alcohol. This information is not intended to replace advice given to you by your health care provider. Make sure you discuss any questions you have with your health care provider. Document Revised: 04/01/2022 Document Reviewed: 04/01/2022 Elsevier Patient Education  2024 Else[employer] Inc. Nausea, Adult Nausea is feeling like you may vomit. Feeling like you may vomit is usually not serious, but it may be an early sign of a more serious medical problem. Vomiting is when stomach contents forcefully come out of your mouth. If you vomit, or if you are not able to drink enough fluids, you may not have enough water in your body (get dehydrated). If you do not have enough water in your body, you may: Feel tired. Feel thirsty. Have a dry mouth. Have cracked lips. Pee (urinate) less often. Older adults and people who have other diseases or a weak body defense system (immune system) have a higher risk of not having enough water in the body. The main goals of treating this condition are: To relieve your nausea. To ensure your nausea occurs less often. To prevent vomiting and losing too much fluid. Follow these instructions at home: Watch your symptoms for any changes. Tell your doctor about them. Eating and drinking     Take an ORS (oral rehydration solution). This is a drink that is sold at pharmacies and stores. Drink clear fluids in small amounts as you are able. These include: Water. Ice chips. Fruit juice that has water added (diluted fruit juice). Low-calorie sports drinks. Eat bland, easy-to-digest foods in small amounts as you are able, such as: Bananas. Applesauce. Rice. Low-fat (lean) meats. Toast. Crackers. Avoid drinking fluids that have a lot of sugar or caffeine in them. This includes energy drinks, sports  drinks, and soda. Avoid alcohol. Avoid spicy or fatty foods. General instructions Take over-the-counter and prescription medicines only as told by your doctor. Rest at home while you get better. Drink enough fluid to keep your pee (urine) pale yellow. Take slow and deep breaths when you feel like you may vomit. Avoid food or things that have strong smells. Wash your hands often with soap and water for at least 20 seconds. If you cannot use soap and water,  use hand sanitizer. Make sure that everyone in your home washes their hands well and often. Keep all follow-up visits. Contact a doctor if: You feel worse. You feel like you may vomit and this lasts for more than 2 days. You vomit. You are not able to drink fluids without vomiting. You have new symptoms. You have a fever. You have a headache. You have muscle cramps. You have a rash. You have pain while peeing. You feel light-headed or dizzy. Get help right away if: You have pain in your chest, neck, arm, or jaw. You feel very weak or you faint. You have vomit that is bright red or looks like coffee grounds. You have bloody or black poop (stools) or poop that looks like tar. You have a very bad headache, a stiff neck, or both. You have very bad pain, cramping, or bloating in your belly (abdomen). You have trouble breathing or you are breathing very quickly. Your heart is beating very quickly. Your skin feels cold and clammy. You feel confused. You have signs of losing too much water in your body, such as: Dark pee, very little pee, or no pee. Cracked lips. Dry mouth. Sunken eyes. Sleepiness. Weakness. These symptoms may be an emergency. Get help right away. Call 911. Do not wait to see if the symptoms will go away. Do not drive yourself to the hospital. Summary Nausea is feeling like you are about vomit. If you vomit, or if you are not able to drink enough fluids, you may not have enough water in your body (get  dehydrated). Eat and drink what your doctor tells you. Take over-the-counter and prescription medicines only as told by your doctor. Contact a doctor right away if your symptoms get worse or you have new symptoms. Keep all follow-up visits. This information is not intended to replace advice given to you by your health care provider. Make sure you discuss any questions you have with your health care provider. Document Revised: 04/19/2021 Document Reviewed: 04/19/2021 Elsevier Patient Education  2024 Else[employer] Inc. Diarrhea, Adult Diarrhea is when you pass loose and sometimes watery poop (stool) often. Diarrhea can make you feel weak and cause you to lose water in your body (get dehydrated). Losing water in your body can cause you to: Feel tired and thirsty. Have a dry mouth. Go pee (urinate) less often. Diarrhea often lasts 2-3 days. It can last longer if it is a sign of something more serious. Be sure to treat your diarrhea as told by your doctor. Follow these instructions at home: Eating and drinking     Follow these instructions as told by your doctor: Take an ORS (oral rehydration solution). This is a drink that helps you replace fluids and minerals your body lost. It is sold at pharmacies and stores. Drink enough fluid to keep your pee (urine) pale yellow. Drink fluids such as: Water. You can also get fluids by sucking on ice chips. Diluted fruit juice. Low-calorie sports drinks. Milk. Avoid drinking fluids that have a lot of sugar or caffeine in them. These include soda, energy drinks, and regular sports drinks. Avoid alcohol. Eat bland, easy-to-digest foods in small amounts as you are able. These foods include: Bananas. Applesauce. Rice. Low-fat (lean) meats. Toast. Crackers. Avoid spicy or fatty foods.   Medicines Take over-the-counter and prescription medicines only as told by your doctor. If you were prescribed antibiotics, take them as told by your doctor. Do not stop  taking them even if you start to feel  better. General instructions  Wash your hands often using soap and water for 20 seconds. If soap and water are not available, use hand sanitizer. Others in your home should wash their hands as well. Wash your hands: After using the toilet or changing a diaper. Before preparing, cooking, or serving food. While caring for a sick person. While visiting someone in a hospital. Rest at home while you get better. Take a warm bath to help with any burning or pain from having diarrhea. Watch your condition for any changes. Contact a doctor if: You have a fever. Your diarrhea gets worse. You have new symptoms. You vomit every time you eat or drink. You feel light-headed, dizzy, or you have a headache. You have muscle cramps. You have signs of losing too much water in your body, such as: Dark pee, very little pee, or no pee. Cracked lips. Dry mouth. Sunken eyes. Sleepiness. Weakness. You have bloody or black poop or poop that looks like tar. You have very bad pain, cramping, or bloating in your belly (abdomen). Your skin feels cold and clammy. You feel confused. Get help right away if: You have chest pain. Your heart is beating very quickly. You have trouble breathing or you are breathing very quickly. You feel very weak or you faint. These symptoms may be an emergency. Get help right away. Call 911. Do not wait to see if the symptoms will go away. Do not drive yourself to the hospital. This information is not intended to replace advice given to you by your health care provider. Make sure you discuss any questions you have with your health care provider. Document Revised: 04/01/2022 Document Reviewed: 04/01/2022 Elsevier Patient Education  2024 Else[employer] Inc. [place] Diet A bland diet may consist of soft foods or foods that are not high in fat or are not greasy, acidic, or spicy. Avoiding certain foods may cause less irritation to your mouth, throat,  stomach, or gastrointestinal tract. Avoiding certain foods may make you feel better. Everyone's tolerances are different. A bland diet should be based on what you can tolerate and what may cause discomfort. What is my plan? Your health care provider or dietitian may recommend specific changes to your diet to treat your symptoms. These changes may include: Eating small meals frequently. Cooking food until it is soft enough to chew easily. Taking the time to chew your food thoroughly, so it is easy to swallow and digest. Avoiding foods that cause you discomfort. These may include spicy food, fried food, greasy foods, hard-to-chew foods, or citrus fruits and juices. Drinking slowly. What are tips for following this plan? Reading food labels To reduce fiber intake, look for food labels that say whole, such as whole wheat or whole grain. Shopping Avoid food items that may have nuts or seeds. Avoid vegetables that may make you gassy or have a tough texture, such as broccoli, cauliflower, or corn. Cooking Cook foods thoroughly so they have a soft texture. Meal planning Make sure you include foods from all food groups to eat a balanced diet. Eat a variety of types of foods. Eat foods and drink beverages that do not cause you discomfort. These may include soups and broths with cooked meats, pasta, and vegetables. Lifestyle Sit up after meals, avoid tight clothing, and take time to eat and chew your food slowly. Ask your health care provider whether you should take dietary supplements. General information Mildly season your foods. Some seasonings, such as cayenne pepper, vinegar, or hot sauce, may  cause irritation. The foods, beverages, or seasonings to avoid should be based on individual tolerance. What foods should I eat? Fruits Canned or cooked fruit such as peaches, pears, or applesauce. Bananas. Vegetables Well-cooked vegetables. Canned or cooked vegetables such as carrots, green beans,  beets, or spinach. Mashed or boiled potatoes. Grains  Hot cereals, such as cream of wheat and processed oatmeal. Rice. Bread, crackers, pasta, or tortillas made from refined white flour. Meats and other proteins  Eggs. Creamy peanut butter or other nut butters. Lean, well-cooked tender meats, such as beef, pork, chicken, or fish. Dairy Low-fat dairy products such as milk, cottage cheese, or yogurt. Beverages  Water. Herbal tea. Apple juice. Fats and oils Mild salad dressings. Canola or olive oil. Sweets and desserts Low-fat pudding, custard, or ice cream. Fruit gelatin. The items listed above may not be a complete list of foods and beverages you can eat. Contact a dietitian for more information. What foods should I avoid? Fruits Citrus fruits, such as oranges and grapefruit. Fruits with a stringy texture. Fruits that have lots of seeds, such as kiwi or strawberries. Dried fruits. Vegetables Raw, uncooked vegetables. Salads. Grains Whole grain breads, muffins, and cereals. Meats and other proteins Tough, fibrous meats. Highly seasoned meat such as corned beef, smoked meats, or fish. Processed high-fat meats such as brats, hot dogs, or sausage. Dairy Full-fat dairy foods such as ice cream and cheese. Beverages Caffeinated drinks. Alcohol. Seasonings and condiments Strongly flavored seasonings or condiments. Hot sauce. Salsa. Other foods Spicy foods. Fried or greasy foods. Sour foods, such as pickled or fermented foods like sauerkraut. Foods high in fiber. The items listed above may not be a complete list of foods and beverages you should avoid. Contact a dietitian for more information. Summary A bland diet should be based on individual tolerance. It may consist of foods that are soft textured and do not have a lot of fat, fiber, acid, or seasonings. A bland diet may be recommended because avoiding certain foods, beverages, or spices may make you feel better. This information is  not intended to replace advice given to you by your health care provider. Make sure you discuss any questions you have with your health care provider. Document Revised: 09/02/2021 Document Reviewed: 09/02/2021 [employer] Patient Education  2024 [employer]  The above assessment and management plan was discussed with the patient. The patient verbalized understanding of and has agreed to the management plan. Patient is aware to call the clinic if they develop any new symptoms or if symptoms persist or worsen. Patient is aware when to return to the clinic for a follow-up visit. Patient educated on when it is appropriate to go to the emergency department.     Eliya Bubar [place] Thompson, DNP [facility] [address] [place], [place] [address] [phone]

## 2024-11-28 ENCOUNTER — Encounter: Admitting: Nurse Practitioner

## 2024-12-19 ENCOUNTER — Ambulatory Visit: Admitting: Nurse Practitioner
